# Patient Record
Sex: Female | Born: 2000 | Race: Black or African American | Hispanic: No | Marital: Single | Smoking: Never smoker
Health system: Southern US, Community
[De-identification: ages and names within clinical notes are randomized; demographics above are authoritative.]

## PROBLEM LIST (undated history)

## (undated) DIAGNOSIS — F32A Depression, unspecified: Secondary | ICD-10-CM

## (undated) DIAGNOSIS — E282 Polycystic ovarian syndrome: Secondary | ICD-10-CM

## (undated) DIAGNOSIS — H547 Unspecified visual loss: Secondary | ICD-10-CM

## (undated) DIAGNOSIS — F329 Major depressive disorder, single episode, unspecified: Secondary | ICD-10-CM

## (undated) DIAGNOSIS — F909 Attention-deficit hyperactivity disorder, unspecified type: Secondary | ICD-10-CM

## (undated) DIAGNOSIS — F419 Anxiety disorder, unspecified: Secondary | ICD-10-CM

## (undated) DIAGNOSIS — F209 Schizophrenia, unspecified: Secondary | ICD-10-CM

## (undated) HISTORY — PX: TONSILLECTOMY: SUR1361

## (undated) HISTORY — PX: ADENOIDECTOMY: SUR15

---
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
## 2021-01-26 ENCOUNTER — Ambulatory Visit (INDEPENDENT_AMBULATORY_CARE_PROVIDER_SITE_OTHER): Payer: Medicaid Other | Admitting: Pediatrics

## 2021-01-26 ENCOUNTER — Encounter: Payer: Self-pay | Admitting: Pediatrics

## 2021-01-26 ENCOUNTER — Other Ambulatory Visit: Payer: Self-pay

## 2021-01-26 VITALS — BP 110/68 | Wt 217.6 lb

## 2021-01-26 DIAGNOSIS — E559 Vitamin D deficiency, unspecified: Secondary | ICD-10-CM | POA: Diagnosis not present

## 2021-01-26 DIAGNOSIS — N926 Irregular menstruation, unspecified: Secondary | ICD-10-CM | POA: Diagnosis not present

## 2021-01-26 DIAGNOSIS — R1084 Generalized abdominal pain: Secondary | ICD-10-CM | POA: Diagnosis not present

## 2021-01-26 NOTE — Patient Instructions (Signed)
I will contact you with the lab results. I will also send the referral to Gynecology for consultation.  Continue ample hydration, diet rich in fruits and vegetables; avoid fatty and spicy foods

## 2021-01-26 NOTE — Progress Notes (Signed)
Subjective:    Patient ID: Sabrina Poole, female    DOB: [birth year], 20 y.o.   MRN: [id number]  HPI Sabrina Poole is here for follow up on response to OCP for irregular menstruation and for follow up on lab abnormalities.  She is accompanied by her mother and younger brother. Sabrina Poole has been followed for irregular menses since at least 2018.  Menarche age 54 years. She has mental health complications with anxiety and originally did not accept referral to Adolescent Medicine (2019) or other intervention until seen at her physical in 09/2020.  Testing was done for PCOS and trial of OCPs to regulate cycles was offered and accepted.  Sabrina Poole states she started the OCP as prescribed but stopped them after the first pack because she felt more depressed and was self-harming (would not elaborate on this).  States she did have a period of withdrawal bleeding with the OCP that lasted one week. States now one month without the OCP and feels much better with her mood. She is not sexually active.  Mom states one positive was that the chronic intermittent abdominal pain was better when she took the OCPs. Sabrina Poole reports "every time I eat it kills my stomach"; reports pain, bloating.  Tolerates fruits and vegetables without symptoms. Normal stool every other day.  No vomiting. Normal urination.  Feels better after napping sometimes; no meds help (tried Maalox, etc). Asleep 10/11 pm to 7 am and pain does not awaken her. Previous labs to look for celiac disease were negative. No other modifying factors or associated symptoms.   PMH, problem list, medications and allergies, family and social history reviewed and updated as indicated. 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 She is currently not followed by any specialty care practices and takes no medication except vitamin and mineral supplement.    Seizures have not been an issue for quite some time; however, she is prescribed medication for mood and ADHD but has chosen not to take them for the past 2 years.  Outpatient Encounter Medications as of 01/26/2021  Medication Sig  . calcium-vitamin D (OSCAL WITH D) 500-200 MG-UNIT tablet Take 1 tablet by mouth.  . Multiple Vitamin (MULTIVITAMIN) tablet Take 1 tablet by mouth daily.  . [DISCONTINUED] famotidine (PEPCID) 20 MG tablet Take 1 tablet (20 mg total) by mouth 2 (two) times daily.  . [DISCONTINUED] norgestimate-ethinyl estradiol (ORTHO-CYCLEN) 0.25-35 MG-MCG tablet Take one tablet daily to manage menstrual cycles.  Do not skip pills.  . [DISCONTINUED] Vitamin D, Ergocalciferol, (DRISDOL) 1.25 MG (50000 UNIT) CAPS capsule Take one capsule by mouth once avery 7 days for 8 weeks to treat low Vitamin D level   No facility-administered encounter medications on file as of 01/26/2021.    Review of Systems As noted above.    Objective:   Physical Exam Vitals and nursing note reviewed.  Constitutional:      Appearance: Normal appearance.  Cardiovascular:     Rate and Rhythm: Normal rate and regular rhythm.     Pulses: Normal pulses.     Heart sounds: Normal heart sounds.  Pulmonary:     Effort: Pulmonary effort is normal. No respiratory distress.     Breath sounds: Normal breath sounds.  Abdominal:     General: There is no distension.     Palpations: Abdomen is soft. There is no mass.     Tenderness: There is no abdominal tenderness. There is no guarding.  Skin:    General: Skin is warm and dry.     Capillary Refill: Capillary refill takes less than 2 seconds.     Comments: Mild acanthosis nigricans at her neck  Neurological:     Mental Status: She is alert.    Wt Readings from Last 3 Encounters:  01/26/21 217 lb 9.6 oz (98.7 kg) (99 %, Z= 2.18)*  10/03/20 213 lb (96.6 kg) (98 %, Z= 2.13)*  10/18/19 205 lb 9.6 oz (93.3 kg) (98 %, Z= 2.06)*   * Growth percentiles are based  on CDC (Girls, 2-20 Years) data.      Assessment & Plan:   1. Irregular menses   2. Vitamin D deficiency   3. Generalized abdominal pain   Discussed alternate therapy to regulate cycles and discussed management by gynecology.  Sabrina Poole stated willingness to follow through with this but would like a female provider and no pelvic exam.  Leading diagnosis still likely PCOS based on obesity, prior signs of hyperinsulinemia (Acanthosis nigricans and past elevated Hemoglobin A1c - last elevated in 2019). Unable to do labs today due to no phlebotomist; scheduled return visit for this. Abdominal pain has not led to unexpected weight loss, vomiting or diarrhea and she is able to eat fruits and vegetables. Will review labs and consider referral to GI to further evaluate abdominal pain and may also look for possible H pylori. She is advised on healthful diet avoiding fatty and spicy foods, ample hydration. Orders Placed This Encounter  Procedures  . VITAMIN D 25 Hydroxy (Vit-D Deficiency, Fractures)  . CBC with Differential/Platelet  . Comprehensive metabolic panel  . Lipase  . Ambulatory referral to Gynecology  Follow up to be determined after labs resulted. Discussed with Sabrina Poole use of her MyChart account to stay informed. Sabrina Poole and her mom voiced understanding and agreement with plan of care. Greater than 50% of this 35 minute face to face encounter spent in counseling for presenting issues. Maree Erie, MD

## 2021-01-28 ENCOUNTER — Other Ambulatory Visit: Payer: Medicaid Other

## 2021-02-09 ENCOUNTER — Other Ambulatory Visit: Payer: Medicaid Other

## 2021-03-02 ENCOUNTER — Ambulatory Visit: Payer: Medicaid Other | Admitting: Advanced Practice Midwife

## 2021-03-26 DIAGNOSIS — F902 Attention-deficit hyperactivity disorder, combined type: Secondary | ICD-10-CM

## 2021-03-26 DIAGNOSIS — G40909 Epilepsy, unspecified, not intractable, without status epilepticus: Secondary | ICD-10-CM

## 2021-04-06 ENCOUNTER — Other Ambulatory Visit: Payer: Self-pay

## 2021-04-06 ENCOUNTER — Encounter: Payer: Self-pay | Admitting: Advanced Practice Midwife

## 2021-04-06 ENCOUNTER — Ambulatory Visit (INDEPENDENT_AMBULATORY_CARE_PROVIDER_SITE_OTHER): Payer: Medicaid Other | Admitting: Advanced Practice Midwife

## 2021-04-06 VITALS — BP 119/79 | HR 63 | Ht 66.0 in | Wt 215.0 lb

## 2021-04-06 DIAGNOSIS — N939 Abnormal uterine and vaginal bleeding, unspecified: Secondary | ICD-10-CM | POA: Diagnosis not present

## 2021-04-06 DIAGNOSIS — N914 Secondary oligomenorrhea: Secondary | ICD-10-CM | POA: Diagnosis not present

## 2021-04-06 MED ORDER — NORETHIN-ETH ESTRAD-FE BIPHAS 1 MG-10 MCG / 10 MCG PO TABS: 1.0000 | ORAL_TABLET | Freq: Every day | ORAL | 11 refills | Status: DC

## 2021-04-06 NOTE — Progress Notes (Signed)
  GYNECOLOGY PROGRESS NOTE  History:  20 y.o. G0P0 presents to [facility] office today for problem gyn visit. She reports periods 1-2 times per year only that are painful.  Menarche was age 45 and and were regular initially, becoming less often around age 54. Periods are 4-8 months apart, last around 5 days each, and are not heavy per pt.  They are, however, very painful, causing missed school/work.  Pediatrician prescribed OCPs but pt had mood changes and did not finish the first pack.  She is not sexually active and denies need for pregnancy prevention or STD testing.  The following portions of the patient's history were reviewed and updated as appropriate: allergies, current medications, past family history, past medical history, past social history, past surgical history and problem list.   Health Maintenance Due  Topic Date Due   HPV VACCINES (1 - 2-dose series) Never done   Hepatitis C Screening  Never done     Review of Systems:  Pertinent items are noted in HPI.   Objective:  Physical Exam Blood pressure 119/79, pulse 63, height 5\' 6"  (1.676 m), weight 215 lb (97.5 kg), last menstrual period 04/04/2021. VS reviewed, nursing note reviewed,  Constitutional: well developed, well nourished, no distress HEENT: normocephalic CV: normal rate Pulm/chest wall: normal effort Breast Exam: deferred Abdomen: soft Neuro: alert and oriented x 3 Skin: warm, dry Psych: affect normal Pelvic exam: Deferred  Assessment & Plan:  1. Abnormal uterine bleeding (AUB) --Pt took OCPs, Ortho cyclen, prescribed by PCP but had mood changes, no thoughts of harming self or others. --Discussed options, including intermittent tx of bleeding with progesterone, switch to lower dose pills, and/or trying other progesterone only forms of contraception.  Discussed risks/benefits of each.   --Pt to try lowest dose pill, f/u in 1 month, or 6 weeks per pt schedule.  --Pt is having bleeding today so start OCPs  today --Call office or seek help if severe depression or thoughts of harming self or others  - Norethindrone-Ethinyl Estradiol-Fe Biphas (LO LOESTRIN FE) 1 MG-10 MCG / 10 MCG tablet; Take 1 tablet by mouth daily.  Dispense: 28 tablet; Refill: 11 - Beta hCG quant (ref lab)  2. Secondary oligomenorrhea --Discussed 04/06/2021, pt would prefer to avoid TVUS if possible, so plan to treat with OCPs and do labwork with close follow up.  - FSH - TSH - Prolactin - Testosterone, Free, Total, SHBG - Beta hCG quant (ref lab)   [place], CNM 12:24 PM

## 2021-04-09 LAB — PROLACTIN: Prolactin: 1.5 ng/mL — ABNORMAL LOW (ref 4.8–23.3)

## 2021-04-09 LAB — FOLLICLE STIMULATING HORMONE: FSH: 4.3 m[IU]/mL

## 2021-04-09 LAB — TESTOSTERONE, FREE, TOTAL, SHBG
Sex Hormone Binding: 22.5 nmol/L — ABNORMAL LOW (ref 24.6–122.0)
Testosterone, Free: 1.3 pg/mL
Testosterone: 24 ng/dL (ref 13–71)

## 2021-04-09 LAB — BETA HCG QUANT (REF LAB): hCG Quant: <1 m[IU]/mL

## 2021-04-09 LAB — TSH: TSH: 1.81 u[IU]/mL (ref 0.450–4.500)

## 2021-05-25 ENCOUNTER — Telehealth: Payer: Self-pay

## 2021-05-25 ENCOUNTER — Ambulatory Visit: Payer: Medicaid Other | Admitting: Advanced Practice Midwife

## 2021-05-25 NOTE — Telephone Encounter (Signed)
Return call to pt /mother regarding triage vm  No answer LVM

## 2021-05-26 ENCOUNTER — Other Ambulatory Visit: Payer: Self-pay | Admitting: Advanced Practice Midwife

## 2021-05-26 DIAGNOSIS — N939 Abnormal uterine and vaginal bleeding, unspecified: Secondary | ICD-10-CM

## 2021-05-26 MED ORDER — SLYND 4 MG PO TABS
4.0000 mg | ORAL_TABLET | Freq: Every day | ORAL | 11 refills | Status: DC
Start: 2021-05-26 — End: 2022-07-27

## 2021-05-26 NOTE — Progress Notes (Signed)
Pt reported mood swings, severe psychological symptoms with lo loestrin Rx taken for only a few days.  She has hx of reaction to higher dose estrogen OCPs as well.  Symptoms improved after stopping OCPs.    Will try progesterone only pills and pt to follow up with provider in the office.  Rx for [facility] sent to pharmacy.

## 2021-12-31 ENCOUNTER — Other Ambulatory Visit: Payer: Self-pay

## 2021-12-31 ENCOUNTER — Ambulatory Visit (INDEPENDENT_AMBULATORY_CARE_PROVIDER_SITE_OTHER): Payer: Medicaid Other | Admitting: Pediatrics

## 2021-12-31 ENCOUNTER — Other Ambulatory Visit (HOSPITAL_COMMUNITY)
Admission: RE | Admit: 2021-12-31 | Discharge: 2021-12-31 | Disposition: A | Discharge status: Home / Self Care | Payer: Medicaid Other | Source: Ambulatory Visit | Attending: Pediatrics | Admitting: Pediatrics

## 2021-12-31 VITALS — BP 118/76 | HR 89 | Ht 66.54 in | Wt 221.6 lb

## 2021-12-31 DIAGNOSIS — Z114 Encounter for screening for human immunodeficiency virus [HIV]: Secondary | ICD-10-CM

## 2021-12-31 DIAGNOSIS — Z113 Encounter for screening for infections with a predominantly sexual mode of transmission: Secondary | ICD-10-CM | POA: Insufficient documentation

## 2021-12-31 DIAGNOSIS — K5900 Constipation, unspecified: Secondary | ICD-10-CM

## 2021-12-31 DIAGNOSIS — Z0000 Encounter for general adult medical examination without abnormal findings: Secondary | ICD-10-CM

## 2021-12-31 DIAGNOSIS — Z68.41 Body mass index (BMI) pediatric, greater than or equal to 95th percentile for age: Secondary | ICD-10-CM | POA: Diagnosis not present

## 2021-12-31 DIAGNOSIS — F32A Depression, unspecified: Secondary | ICD-10-CM

## 2021-12-31 DIAGNOSIS — E559 Vitamin D deficiency, unspecified: Secondary | ICD-10-CM

## 2021-12-31 DIAGNOSIS — IMO0002 Reserved for concepts with insufficient information to code with codable children: Secondary | ICD-10-CM

## 2021-12-31 DIAGNOSIS — Z13220 Encounter for screening for lipoid disorders: Secondary | ICD-10-CM

## 2021-12-31 LAB — POCT RAPID HIV: Rapid HIV, POC: NEGATIVE

## 2021-12-31 NOTE — Patient Instructions (Signed)
Continue healthy variety of foods - fruits, vegetables, lean meats, grains like oats/whole wheat/brown rice. ? ?Lot so fluids to drink - mostly water. ? ?I am sending a prescription for Miralax to help you have more regular stools.  Mix as prescribed; decrease dose to 1/2 cap or skip dose if stools too loose ? ? ?Get some daylight exposure EVERY DAY - outside when the weather is good, inside with curtains open if weather is not good. ? ?Please contact your psychiatrist to restart med for mood - explain to that doctor how your medication has not been  a good match in the past. ? ?Embrace the wonderful you that you are! ?Look into careers that make you happy. ?Enjoy time with family and friends and let them know when you need a break for some alone time. ? ?See you in 3 months! ?

## 2021-12-31 NOTE — Progress Notes (Signed)
Adolescent Well Care Visit ?Sabrina Poole is a 21 y.o. female who is here for well care. ?Sabrina Poole has chronic health issues of Sturge-Weber syndrome, chronic depression, seizure disorder, ADHD and irregular menstrual cycles.. ?   ?PCP:  Maree Erie, MD ? ? History was provided by the patient.  Her mother is present for initial portion of visit and waits in hallway during physical exam. ? ?Confidentiality was discussed with the patient and, if applicable, with caregiver as well. ?Patient's personal or confidential phone number: [phone] ? ? ?Current Issues: ?Current concerns include doing well except depression. ?No meds now except Vitamin D.  ?- She was seen 04/06/2021 by Gyn [facility]) about abnormal uterine bleeding and hormonal management prescribed as OCP.  Iysis states she stopped taking the medication because it made her mood worse (depression). ?- She was seen by Neurology ([facility]) on 11/16/2021 about chronic seizure disorder.  Lamictal prescribed; however, she has chosen to not take it. ? ?Nutrition: ?Nutrition/Eating Behaviors: fruits and vegetables for good variety of foods.  Follows gluten-free guidelines. ?Adequate calcium in diet?: no milk ?Supplements/ Vitamins: Vitamin D 1000 units daily ? ?Elimination: ?Normal urinary habits. ?Goes days with no stool and has bloating.   ? ?Exercise/ Media: ?Play any Sports?/ Exercise: walking at least 3 times a week ?Screen Time:  > 2 hours-counseling provided - likes games and states she plays video games with her boyfriend. Smiles and tells MD this makes her happy. ?Media Rules or Monitoring?: yes ? ?Sleep:  ?Sleep: 9/10 pm to 5/6 am; occasionally wakes up at night and sometimes naps ? ?Social Screening: ?Lives with:  parents and younger siblings ?Parental relations:  good ?Activities, Work, and Chores?: washes dishes and cleans the living room ?Concerns regarding behavior with peers?  None - sticks with family ?Stressors of note: none  stated ? ?Education: ?School Name: graduated online school class of 2022  ?Has not made plans for career or employment. ? ?Menstruation:   ?Menstrual History: last period was Late Feb for 4/5 days but not taking pills  ? ?Confidential Social History: ?Tobacco?  no ?Secondhand smoke exposure?  no ?Drugs/ETOH?  marijuana ? ?Sexually Active?  no   ?Pregnancy Prevention: abstinence ? ?Safe at home, in school & in relationships?  Yes ?Safe to self?  See PHQ-9 ? ?Screenings: ?Patient has a dental home: yes ?Goes for eye exam end of March ? ?The patient completed the Rapid Assessment for Adolescent Preventive Services screening questionnaire and the following topics were identified as risk factors and discussed: healthy eating, exercise, marijuana use, suicidality/self harm, and mental health issues  ?In addition, the following topics were discussed as part of anticipatory guidance mental health issues, screen time, and sleep . ? ?PHQ-9 completed and results indicated increased risk with score of 17; past thoughts of self-harm. ?Mom filled in most of information with Denette's response.  Mom also stated depression symptoms observed at home. ?Sabrina Poole has been prescribed medication in the past but states she does not like how meds make her feel and does not like taking pills. ? ?The patient completed the Transition Skills Assessment for Young Adults screening questionnaire and the following topics were identified as learning needs and discussed:  medical information, insurance and office number. Also, family history and how to complete forms.  ? ?Physical Exam:  ?Vitals:  ? 12/31/21 1031  ?BP: 118/76  ?Pulse: 89  ?Weight: 221 lb 9.6 oz (100.5 kg)  ?Height: 5' 6.54" (1.69 m)  ? ?Wt Readings from Last  3 Encounters:  ?12/31/21 221 lb 9.6 oz (100.5 kg) (99 %, Z= 2.23)*  ?04/06/21 215 lb (97.5 kg) (98 %, Z= 2.15)*  ?01/26/21 217 lb 9.6 oz (98.7 kg) (99 %, Z= 2.18)*  ? ?* Growth percentiles are based on CDC (Girls, 2-20 Years) data.   ?  ?BP 118/76 (BP Location: Left Arm, Patient Position: Sitting)   Pulse 89   Ht 5' 6.54" (1.69 m)   Wt 221 lb 9.6 oz (100.5 kg)   BMI 35.19 kg/m?  ?Body mass index: body mass index is 35.19 kg/m?. ?Blood pressure percentiles are not available for patients who are 18 years or older. ? ?BP Readings from Last 3 Encounters:  ?12/31/21 118/76  ?04/06/21 119/79  ?01/26/21 110/68  ?  ? ?Hearing Screening  ?Method: Audiometry  ? 500Hz  1000Hz  2000Hz  4000Hz   ?Right ear 20 20 20 20   ?Left ear 20 20 20 20   ? ?Vision Screening  ? Right eye Left eye Both eyes  ?Without correction 20/80 20/80 20/80   ?With correction     ? ? ?General Appearance:   alert, oriented, no acute distress; however, she was initially wearing hood and cap so her face was covered.  Removed both and engaged with MD with good eye contact and often smiled  ?HENT: Normocephalic, no obvious abnormality, conjunctiva clear  ?Mouth:   Normal appearing teeth, no obvious discoloration, dental caries, or dental caps  ?Neck:   Supple; thyroid: no enlargement, symmetric, no tenderness/mass/nodules  ?Chest Normal female  ?Lungs:   Clear to auscultation bilaterally, normal work of breathing  ?Heart:   Regular rate and rhythm, S1 and S2 normal, no murmurs;   ?Abdomen:   Soft, non-tender, no mass, or organomegaly  ?GU genitalia not examined  ?Musculoskeletal:   Tone and strength strong and symmetrical, all extremities             ?  ?Lymphatic:   No cervical adenopathy  ?Skin/Hair/Nails:   Skin warm, dry and intact, no rashes, no bruises or petechiae.  Minimal facial acne lesions.  Striae at arms.  No significant acanthosis  ?Neurologic:   Strength, gait, and coordination normal and age-appropriate  ? ? ? ?Assessment and Plan:  ? ?1. Encounter for general adult medical examination without abnormal findings ?Hearing screening result:normal ?Vision screening result: abnormal; has appointment for vision care later this month ?Age appropriate anticipatory guidance  provided. ? ?BMI at 97%; discussed health at any size with patient. ?Review of her BMI curve had been in this range since age 33 years. ?Weight change has limited to 4 pound increase in the past year; also margin of error because she is wearing a thick hooded sweat suit today. ? ?2. Screening examination for venereal disease ?Routine screening; will contact patient with management as needed. ?- POCT Rapid HIV ?- Urine cytology ancillary only ? ?3. Vitamin D deficiency ?History of low values with las Vit D level of 12 on Nov 16, 2021 at outside center ([facility]). ?Will check today to see if medical intervention was successful in raising level to normal range. ?- VITAMIN D 25 Hydroxy (Vit-D Deficiency, Fractures) ? ?4. Screening, lipid ?Cholesterol last checked in 2020; she has increased risk due to obesity. ?Will follow up with results. ?- Lipid panel ? ?5. Constipation, unspecified constipation type ?Discussed healthful diet with fiber and adequate water intake.  Will prescribe Miralax to use as needed to maintain one or 2 soft stools at least every other day. ?- polyethylene glycol powder (GLYCOLAX/MIRALAX) 17 GM/SCOOP  powder; Mix one capful in 8 ounces of liquid and drink daily when needed to manage constipation  Dispense: 506 g; Refill: 2 ? ?6. Depression, unspecified depression type ?Chronic problem affecting her emotional, social and physical well-being.  She lives at home and mom is active in supervising her wellness and safety.  I talked alone with [name] about need to speak up with specialists about how her meds make her feel and what her goals are for wellness; this allows change in meds to select one appropriate for he long term management. ?She agreed to contact her psychiatrist for appointment. ?Also, discussed acts of wellness with [name] like daylight exposure, healthful eating, hydration and exercise. ?She will follow up with me in 3 months and as needed. ?[facility] her next week to  see if she needs assistance in connecting with psychiatry. ? ?[name] completed transition to adult med form with minor gaps in readiness not identified; however, she will likely benefit from remaining with this practice u

## 2022-01-01 ENCOUNTER — Encounter: Payer: Self-pay | Admitting: Pediatrics

## 2022-01-01 DIAGNOSIS — E559 Vitamin D deficiency, unspecified: Secondary | ICD-10-CM

## 2022-01-01 LAB — LIPID PANEL
Cholesterol: 170 mg/dL — ABNORMAL HIGH (ref <170)
HDL: 42 mg/dL — ABNORMAL LOW (ref >45)
LDL Cholesterol (Calc): 104 mg/dL (calc) (ref <110)
Non-HDL Cholesterol (Calc): 128 mg/dL (calc) — ABNORMAL HIGH (ref <120)
Total CHOL/HDL Ratio: 4 (calc) (ref <5.0)
Triglycerides: 140 mg/dL — ABNORMAL HIGH (ref <90)

## 2022-01-01 LAB — URINE CYTOLOGY ANCILLARY ONLY
Chlamydia: NEGATIVE
Comment: NEGATIVE
Comment: NORMAL
Neisseria Gonorrhea: NEGATIVE

## 2022-01-01 LAB — VITAMIN D 25 HYDROXY (VIT D DEFICIENCY, FRACTURES): Vit D, 25-Hydroxy: 17 ng/mL — ABNORMAL LOW (ref 30–100)

## 2022-01-04 ENCOUNTER — Encounter: Payer: Self-pay | Admitting: Pediatrics

## 2022-01-04 MED ORDER — VITAMIN D (ERGOCALCIFEROL) 1.25 MG (50000 UNIT) PO CAPS: ORAL_CAPSULE | ORAL | 0 refills | Status: DC

## 2022-01-04 MED ORDER — POLYETHYLENE GLYCOL 3350 17 GM/SCOOP PO POWD: ORAL | 2 refills | Status: DC

## 2022-02-26 ENCOUNTER — Encounter: Payer: Self-pay | Admitting: Pediatrics

## 2022-04-05 ENCOUNTER — Encounter: Payer: Self-pay | Admitting: Pediatrics

## 2022-04-05 ENCOUNTER — Ambulatory Visit (INDEPENDENT_AMBULATORY_CARE_PROVIDER_SITE_OTHER): Payer: Medicaid Other | Admitting: Pediatrics

## 2022-04-05 VITALS — BP 122/74 | Ht 66.3 in | Wt 210.8 lb

## 2022-04-05 DIAGNOSIS — Z7187 Encounter for pediatric-to-adult transition counseling: Secondary | ICD-10-CM

## 2022-04-05 DIAGNOSIS — R109 Unspecified abdominal pain: Secondary | ICD-10-CM

## 2022-04-05 DIAGNOSIS — N898 Other specified noninflammatory disorders of vagina: Secondary | ICD-10-CM

## 2022-04-05 DIAGNOSIS — E559 Vitamin D deficiency, unspecified: Secondary | ICD-10-CM

## 2022-04-05 DIAGNOSIS — G8929 Other chronic pain: Secondary | ICD-10-CM

## 2022-04-05 DIAGNOSIS — F32A Depression, unspecified: Secondary | ICD-10-CM

## 2022-04-05 NOTE — Progress Notes (Unsigned)
   Subjective:    Patient ID: Sabrina Poole, female    DOB: October 09, 2001, 21 y.o.   MRN: [id number]  HPI  [id number]   Completed Vitamin D supplement and is here for lab draw. 2.    Problem with vaginal itching since period x 2 cycles; no noted discharge. 3.    Would like to restart meds for depression 4.    Would like transition to same location as mom Review of Systems As noted in HPI above    Objective:   Physical Exam    BP Readings from Last 3 Encounters:  04/05/22 122/74  12/31/21 118/76  04/06/21 119/79    BP Readings from Last 3 Encounters:  04/05/22 122/74  12/31/21 118/76  04/06/21 119/79       Assessment & Plan:

## 2022-04-06 LAB — WET PREP BY MOLECULAR PROBE
Candida species: NOT DETECTED
MICRO NUMBER:: 13543287
SPECIMEN QUALITY:: ADEQUATE
Trichomonas vaginosis: NOT DETECTED

## 2022-04-06 LAB — VITAMIN D 25 HYDROXY (VIT D DEFICIENCY, FRACTURES): Vit D, 25-Hydroxy: 59 ng/mL (ref 30–100)

## 2022-04-07 NOTE — Telephone Encounter (Signed)
Sabrina Poole is scheduled for labs on 6/22 and will follow up with me during the wait between breath samples.

## 2022-04-08 ENCOUNTER — Ambulatory Visit (INDEPENDENT_AMBULATORY_CARE_PROVIDER_SITE_OTHER): Payer: Medicaid Other | Admitting: Pediatrics

## 2022-04-08 ENCOUNTER — Other Ambulatory Visit: Payer: Medicaid Other

## 2022-04-08 ENCOUNTER — Encounter: Payer: Self-pay | Admitting: Pediatrics

## 2022-04-08 VITALS — Wt 210.0 lb

## 2022-04-08 DIAGNOSIS — B9689 Other specified bacterial agents as the cause of diseases classified elsewhere: Secondary | ICD-10-CM

## 2022-04-08 DIAGNOSIS — E559 Vitamin D deficiency, unspecified: Secondary | ICD-10-CM | POA: Diagnosis not present

## 2022-04-08 DIAGNOSIS — R1084 Generalized abdominal pain: Secondary | ICD-10-CM | POA: Diagnosis not present

## 2022-04-08 DIAGNOSIS — N760 Acute vaginitis: Secondary | ICD-10-CM

## 2022-04-08 MED ORDER — METRONIDAZOLE 500 MG PO TABS: ORAL_TABLET | ORAL | 0 refills | Status: DC

## 2022-04-08 NOTE — Patient Instructions (Signed)
You will get a call (listed mom's number) about new psychiatry appointment and about establishing care with [facility] on [facility].  Please let me know if you do not receive any acknowledgement of referrals by July 7th

## 2022-04-08 NOTE — Progress Notes (Signed)
   Subjective:    Patient ID: Harrietta Guardian, female    DOB: May 01, 2001, 21 y.o.   MRN: [id number]  HPI Katrese is here today for her urea breath test and to review her labs.  Shandrea reports doing well with no new concerns today. No recent ills. No change in meds. No sexual activity. No alcohol.  PMH, problem list, medications and allergies, family and social history reviewed and updated as indicated.   Review of Systems  Constitutional: Negative.       Objective:   Physical Exam Vitals and nursing note reviewed.  Constitutional:      General: She is not in acute distress.    Appearance: Normal appearance.  Neurological:     Mental Status: She is alert.        Assessment & Plan:   1. Bacterial vaginitis I discussed with Alima that recent vaginal swab (04/05/21) returned positive for Gardnerella vaginalis and significance of this explained.  Discussed treatment options of oral or vaginal meds and Haven chose oral meds.  No current contraindication to medication.  Reviewed dosing and sent to pharmacy.  Cautioned no alcohol.  Follow up as needed. Meds ordered this encounter  Medications   metroNIDAZOLE (FLAGYL) 500 MG tablet    Sig: Take one tablet by mouth twice a day for 7 days to treat vaginitis    Dispense:  14 tablet    Refill:  0    2. Vitamin D deficiency Discussed recent lab value normal; continue OTC Vitamin D supplement.  3. Generalized abdominal pain Urea breath test done today; I verified with lab technician that results should be available tomorrow. Discussed with Derhonda I will contact her and if positive, medications will be needed.  She voiced understanding and agreement with care plan from today.   Time spent reviewing documentation and services related to visit: 2 min Time spent face-to-face with patient for visit: 10 min Time spent not face-to-face with patient for documentation and care coordination: 5 min  Maree Erie, MD

## 2022-04-08 NOTE — Patient Instructions (Addendum)
Take the Metronidazole (also known as Flagyl) as prescribed 2 times a day for 7 days. Let me know if you have any problems.  I will contact you once the breath test is resulted.  Bacterial Vaginosis  Bacterial vaginosis is an infection of the vagina. It happens when too many normal germs (healthy bacteria) grow in the vagina. This infection can make it easier to get other infections from sex (STIs). It is very important for pregnant women to get treated. This infection can cause babies to be born early or at a low birth weight. What are the causes? This infection is caused by an increase in certain germs that grow in the vagina. You cannot get this infection from toilet seats, bedsheets, swimming pools, or things that touch your vagina. What increases the risk? Having sex with a new person or more than one person. Having sex without protection. Douching. Having an intrauterine device (IUD). Smoking. Using drugs or drinking alcohol. These can lead you to do things that are risky. Taking certain antibiotic medicines. Being pregnant. What are the signs or symptoms? Some women have no symptoms. Symptoms may include: A discharge from your vagina. It may be gray or white. It can be watery or foamy. A fishy smell. This can happen after sex or during your menstrual period. Itching in and around your vagina. A feeling of burning or pain when you pee (urinate). How is this treated? This infection is treated with antibiotic medicines. These may be given to you as: A pill. A cream for your vagina. A medicine that you put into your vagina (suppository). If the infection comes back after treatment, you may need more antibiotics. Follow these instructions at home: Medicines Take over-the-counter and prescription medicines as told by your doctor. Take or use your antibiotic medicine as told by your doctor. Do not stop taking or using it, even if you start to feel better. General instructions If  the person you have sex with is a woman, tell her that you have this infection. She will need to follow up with her doctor. If you have a female partner, he does not need to be treated. Do not have sex until you finish treatment. Drink enough fluid to keep your pee pale yellow. Keep your vagina and butt clean. Wash the area with warm water each day. Wipe from front to back after you use the toilet. If you are breastfeeding a baby, ask your doctor if you should keep doing so during treatment. Keep all follow-up visits. How is this prevented? Self-care Do not douche. Use only warm water to wash around your vagina. Wear underwear that is cotton or lined with cotton. Do not wear tight pants and pantyhose, especially in the summer. Safe sex Use protection when you have sex. This includes: Use condoms. Use dental dams. This is a thin layer that protects the mouth during oral sex. Limit how many people you have sex with. To prevent this infection, it is best to have sex with just one person. Get tested for STIs. The person you have sex with should also get tested. Drugs and alcohol Do not smoke or use any products that contain nicotine or tobacco. If you need help quitting, ask your doctor. Do not use drugs. Do not drink alcohol if: Your doctor tells you not to drink. You are pregnant, may be pregnant, or are planning to become pregnant. If you drink alcohol: Limit how much you have to 0-1 drink a day. Know how much  alcohol is in your drink. In the [place] one drink equals one 12 oz bottle of beer (355 mL), one 5 oz glass of wine (148 mL), or one 1 oz glass of hard liquor (44 mL). Where to find more information Centers for Disease Control and Prevention: FootballExhibition.com.br American Sexual Health Association: www.ashastd.org Office on [employer] Health: http://hoffman.com/ Contact a doctor if: Your symptoms do not get better, even after you are treated. You have more discharge or pain when you  pee. You have a fever or chills. You have pain in your belly (abdomen) or in the area between your hips. You have pain with sex. You bleed from your vagina between menstrual periods. Summary This infection can happen when too many germs (bacteria) grow in the vagina. This infection can make it easier to get infections from sex (STIs). Treating this can lower that chance. Get treated if you are pregnant. This infection can cause babies to be born early. Do not stop taking or using your antibiotic medicine, even if you start to feel better. This information is not intended to replace advice given to you by your health care provider. Make sure you discuss any questions you have with your health care provider. Document Revised: 04/03/2020 Document Reviewed: 04/03/2020 Elsevier Patient Education  2023 [employer].

## 2022-04-13 LAB — H. PYLORI BREATH TEST: H. pylori Breath Test: NOT DETECTED

## 2022-05-25 ENCOUNTER — Encounter: Payer: Self-pay | Admitting: Pediatrics

## 2022-07-27 ENCOUNTER — Other Ambulatory Visit (HOSPITAL_COMMUNITY)
Admission: RE | Admit: 2022-07-27 | Discharge: 2022-07-27 | Disposition: A | Discharge status: Home / Self Care | Payer: Medicaid Other | Source: Ambulatory Visit | Attending: Advanced Practice Midwife | Admitting: Advanced Practice Midwife

## 2022-07-27 ENCOUNTER — Encounter: Payer: Self-pay | Admitting: Advanced Practice Midwife

## 2022-07-27 ENCOUNTER — Ambulatory Visit (INDEPENDENT_AMBULATORY_CARE_PROVIDER_SITE_OTHER): Payer: Medicaid Other | Admitting: Advanced Practice Midwife

## 2022-07-27 VITALS — BP 120/75 | HR 66 | Ht 65.0 in | Wt 203.0 lb

## 2022-07-27 DIAGNOSIS — N898 Other specified noninflammatory disorders of vagina: Secondary | ICD-10-CM | POA: Insufficient documentation

## 2022-07-27 DIAGNOSIS — Z113 Encounter for screening for infections with a predominantly sexual mode of transmission: Secondary | ICD-10-CM | POA: Diagnosis present

## 2022-07-27 DIAGNOSIS — E282 Polycystic ovarian syndrome: Secondary | ICD-10-CM

## 2022-07-27 DIAGNOSIS — Z1331 Encounter for screening for depression: Secondary | ICD-10-CM

## 2022-07-27 DIAGNOSIS — N913 Primary oligomenorrhea: Secondary | ICD-10-CM | POA: Diagnosis not present

## 2022-07-27 DIAGNOSIS — R102 Pelvic and perineal pain: Secondary | ICD-10-CM

## 2022-07-27 MED ORDER — NORGESTIMATE-ETH ESTRADIOL 0.25-35 MG-MCG PO TABS: 1.0000 | ORAL_TABLET | Freq: Every day | ORAL | 11 refills | Status: DC

## 2022-07-27 NOTE — Progress Notes (Signed)
21 y.o. GYN presents for abdominal/pelvic pain 5-8/10 x 6 months.  She is requesting and [place] and labs to check her harmones.  PHQ-9=15

## 2022-07-27 NOTE — Progress Notes (Signed)
   GYNECOLOGY PROGRESS NOTE  History:  21 y.o. G0P0 presents to [place] office today for problem gyn visit. She reports no menses in 3-4 months, increased hair growth on face and body, and intermittent pelvic pain that is severe.  She is concerned that her PCOS is worsening.  She tried OCPs with mood side effects 2-3 years ago, so low dose OCP with loloestrin was initiated  04/06/21.  Pt had side effects, including mood changes, so Slynd POP was initiated 05/26/21.  Pt did not have menses, and continue to have other symptoms with Slynd. She is not currently taking the Slynd.   She denies h/a, dizziness, shortness of breath, n/v, or fever/chills.    The following portions of the patient's history were reviewed and updated as appropriate: allergies, current medications, past family history, past medical history, past social history, past surgical history and problem list.   Health Maintenance Due  Topic Date Due   COVID-19 Vaccine (1) Never done   HPV VACCINES (1 - 2-dose series) Never done   Hepatitis C Screening  Never done   TETANUS/TDAP  03/23/2022   INFLUENZA VACCINE  05/18/2022     Review of Systems:  Pertinent items are noted in HPI.   Objective:  Physical Exam Blood pressure 120/75, pulse 66, height 5\' 5"  (1.651 m), weight 203 lb (92.1 kg), last menstrual period 04/28/2022. VS reviewed, nursing note reviewed,  Constitutional: well developed, well nourished, no distress HEENT: normocephalic CV: normal rate Pulm/chest wall: normal effort Breast Exam: deferred Abdomen: soft Neuro: alert and oriented x 3 Skin: warm, dry Psych: affect normal Pelvic exam: Cervix pink, visually closed, without lesion, scant white creamy discharge, vaginal walls and external genitalia normal Bimanual exam: Cervix 0/long/high, firm, anterior, neg CMT, uterus nontender, nonenlarged, adnexa without tenderness, enlargement, or mass  Assessment & Plan:  1. Vaginal discharge  - Cervicovaginal ancillary  only( [place])  2. Positive screening for depression on 9-item Patient Health Questionnaire (PHQ-9)  - Ambulatory referral to [facility]  3. Primary oligomenorrhea --Pt last took OCPs a couple of years ago, in her teens.  She is not improved with POPs, initiated last year. Discussed options with pt, and pt would like to try OCPs again, to see if periods and mood can be regulated.   --Discussed how hair growth is not usually improved by OCPs alone, and may need additional treatment. --F/U in 2 months after initiating Sprintec today --Labs to evaluate other causes  - TSH - FSH - LH - Prolactin - Testosterone - US PELVIC COMPLETE WITH TRANSVAGINAL; Future - norgestimate-ethinyl estradiol (ORTHO-CYCLEN) 0.25-35 MG-MCG tablet; Take 1 tablet by mouth daily.  Dispense: 28 tablet; Refill: 11  4. Routine screening for STI (sexually transmitted infection)  - Cervicovaginal ancillary only( [place]) - HIV antibody (with reflex) - Hepatitis C Antibody - Hepatitis B Surface AntiGEN - [facility]  5. Acute pelvic pain, female --Pain intermittent, no pattern, not associated with menses  - US PELVIC COMPLETE WITH TRANSVAGINAL; Future   Return in about 2 months (around 09/26/2022) for Gyn follow up for Abnormal Uterine Bleeding with me.   Fatima Blank, CNM 12:31 PM

## 2022-07-28 LAB — TESTOSTERONE: Testosterone: 40 ng/dL (ref 13–71)

## 2022-07-28 LAB — CERVICOVAGINAL ANCILLARY ONLY
Bacterial Vaginitis (gardnerella): NEGATIVE
Candida Glabrata: NEGATIVE
Candida Vaginitis: NEGATIVE
Chlamydia: NEGATIVE
Comment: NEGATIVE
Comment: NEGATIVE
Comment: NEGATIVE
Comment: NEGATIVE
Comment: NEGATIVE
Comment: NORMAL
Neisseria Gonorrhea: NEGATIVE
Trichomonas: NEGATIVE

## 2022-07-28 LAB — LUTEINIZING HORMONE: LH: 11.7 m[IU]/mL

## 2022-07-28 LAB — HIV ANTIBODY (ROUTINE TESTING W REFLEX): HIV Screen 4th Generation wRfx: NONREACTIVE

## 2022-07-28 LAB — TSH: TSH: 1.31 u[IU]/mL (ref 0.450–4.500)

## 2022-07-28 LAB — HEPATITIS C ANTIBODY: Hep C Virus Ab: NONREACTIVE

## 2022-07-28 LAB — FOLLICLE STIMULATING HORMONE: FSH: 2.3 m[IU]/mL

## 2022-07-28 LAB — PROLACTIN: Prolactin: 1.5 ng/mL — ABNORMAL LOW (ref 4.8–23.3)

## 2022-07-28 LAB — HEPATITIS B SURFACE ANTIGEN: Hepatitis B Surface Ag: NEGATIVE

## 2022-07-28 LAB — RPR: RPR Ser Ql: NONREACTIVE

## 2022-08-02 ENCOUNTER — Ambulatory Visit
Admission: RE | Admit: 2022-08-02 | Discharge: 2022-08-02 | Disposition: A | Discharge status: Home / Self Care | Payer: Medicaid Other | Source: Ambulatory Visit | Attending: Advanced Practice Midwife | Admitting: Advanced Practice Midwife

## 2022-08-02 ENCOUNTER — Other Ambulatory Visit: Payer: Self-pay | Admitting: Advanced Practice Midwife

## 2022-08-02 ENCOUNTER — Encounter: Payer: Self-pay | Admitting: Advanced Practice Midwife

## 2022-08-02 DIAGNOSIS — N913 Primary oligomenorrhea: Secondary | ICD-10-CM | POA: Diagnosis present

## 2022-08-02 DIAGNOSIS — R102 Pelvic and perineal pain: Secondary | ICD-10-CM | POA: Diagnosis present

## 2022-08-09 ENCOUNTER — Encounter (HOSPITAL_COMMUNITY): Payer: Self-pay | Admitting: Student in an Organized Health Care Education/Training Program

## 2022-08-09 ENCOUNTER — Ambulatory Visit (HOSPITAL_BASED_OUTPATIENT_CLINIC_OR_DEPARTMENT_OTHER): Payer: Medicaid Other | Admitting: Student in an Organized Health Care Education/Training Program

## 2022-08-09 DIAGNOSIS — F411 Generalized anxiety disorder: Secondary | ICD-10-CM

## 2022-08-09 DIAGNOSIS — F339 Major depressive disorder, recurrent, unspecified: Secondary | ICD-10-CM

## 2022-08-09 DIAGNOSIS — F209 Schizophrenia, unspecified: Secondary | ICD-10-CM

## 2022-08-09 MED ORDER — RISPERIDONE 1 MG PO TABS: 1.0000 mg | ORAL_TABLET | Freq: Two times a day (BID) | ORAL | 1 refills | Status: DC

## 2022-08-09 NOTE — Progress Notes (Signed)
Psychiatric Initial Adult Assessment   Patient Identification: Shemicka Cohrs MRN:  [id number] Date of Evaluation:  08/09/2022 Referral Source: Maree Erie, MD Chief Complaint:   Chief Complaint  Patient presents with   Establish Care   Hallucinations   Visit Diagnosis:    ICD-10-CM   1. Schizophrenia, unspecified type (HCC)  F20.9 risperiDONE (RISPERDAL) 1 MG tablet    2. GAD (generalized anxiety disorder)  F41.1     3. Episode of recurrent major depressive disorder, unspecified depression episode severity (HCC)  F33.9       History of Present Illness:   Chrissa Meetze is a 21 yr old female who presents to establish care and for medication management.  PPHx is significant for Schizophrenia, Depression, Anxiety, and ADHD, Multiple Suicide Attempts (would not discuss reports last was 2-3 yrs ago), and Self Injurious Behavior (cutting/scratching last was 9/23), and no hospitalizations.  PMH is significant for Sturge Weber Syndrome, Epilepsy, and Non-Epileptic Seizures.   Patient presents today with her mother.  When asked why she was here today she shrugged her shoulders and stated she did not know.  The mother then reported that patient had schizophrenia, depression, anxiety, ADHD, anxiety induced seizures, impulse control issues, and concerns for bipolar disorder.  She reports that the patient had been taking medications in the past but decided to stop taking them and her symptoms had returned and been worsening.  Patient reports she stopped taking her medicines about 3 years ago and when asked why she states she just did not want to keep taking them.  She reports that she does see and hear things.  When asked to describe what she sees she states she cannot and usually has to draw them and the mother confirms that they are not humanoid shapes but the patient draws.  Patient reports hearing crowds and sometimes babies crying.  When asked about manic symptoms she does endorse high energy,  mood lability, decreased need of sleep, and starting several things and not finishing them.  When asked the last time when these episodes happened she reports that they happen all the time and could not give a definitive timeframe.  She reports past psychiatric history significant for schizophrenia, depression, anxiety, and ADHD.  She reports that there have been multiple suicide attempts in the past the last 1 being about 2 to 3 years ago.  When asked how she has attempted she reported that she could not say and began crying.  She does endorse a history of self-injurious behavior via scratching/cutting.  She reports the last episode happened about a month ago.  She reports no history of psychiatric hospitalizations and the mother confirms this.  They report patient has been on Adderall, Risperdal, and Zoloft in the past and many more but they cannot remove the names of them.  Past medical history is significant for Sturge Weber syndrome.  Past surgical history is significant for tonsillectomy/adenoidectomy in 2018.  She reports NKDA.  She reports no history of head trauma.  The mother reports patient did have a history of seizures starting at 86 months old given the Sturge Weber diagnosis.  There was also a hospitalization in 2017 for seizure-like activity.  She reports currently lives at her mother's house with her 3 siblings.  She reports she did graduate high school and reports no IEP.  She reports no college.  She reports she is not currently working.  She reports no alcohol use.  She reports no tobacco use.  She reports  THC use and when asked for frequency states whenever she feels like it.  She reports no current legal issues.  She reports no access to firearms.  Discussed restarting Risperdal since it controlled her symptoms well in the past.  Discussed potential risks and side effects and she was agreeable to a trial.  Discussed waiting for our follow-up appointment to see what further medication  changes would need to be made and she was agreeable to this.  She reports no SI, HI, or AVH.  She reports her sleep is good.  She reports her appetite is doing good.  Discussed with them what to do in the event of a future crisis.  Discussed that she can return to [facility], go to [facility], go to the nearest ED, or call [phone] or [phone].   They reported understanding and had no concerns.    Associated Signs/Symptoms: Depression Symptoms:  depressed mood, anhedonia, fatigue, anxiety, panic attacks, loss of energy/fatigue, disturbed sleep, (Hypo) Manic Symptoms:  Distractibility, Flight of Ideas, Irritable Mood, Labiality of Mood, Anxiety Symptoms:  Excessive Worry, Panic Symptoms, Social Anxiety, Psychotic Symptoms:  Hallucinations: Auditory Visual Paranoia, PTSD Symptoms: NA  Past Psychiatric History: Schizophrenia, Depression, Anxiety, and ADHD, Multiple Suicide Attempts (would not discuss reports last was 2-3 yrs ago), and Self Injurious Behavior (cutting/scratching last was 9/23), and no hospitalizations  Previous Psychotropic Medications: Yes   Substance Abuse History in the last 12 months:  Yes.    Consequences of Substance Abuse: NA  Past Medical History:  Past Medical History:  Diagnosis Date   ADHD (attention deficit hyperactivity disorder)    Anxiety    mild anxiety   Decreased visual acuity 12/03/2015   Depression    Schizophrenia (HCC)     Past Surgical History:  Procedure Laterality Date   ADENOIDECTOMY     RADIOLOGY WITH ANESTHESIA N/A 11/12/2015   Procedure: RADIOLOGY WITH ANESTHESIA;  Surgeon: Medication Radiologist, MD;  Location: [facility] OR;  Service: Radiology;  Laterality: N/A;   TONSILLECTOMY     TONSILLECTOMY AND ADENOIDECTOMY N/A 02/22/2017   Procedure: TONSILLECTOMY AND ADENOIDECTOMY;  Surgeon: Newman Pies, MD;  Location:  SURGERY CENTER;  Service: ENT;  Laterality: N/A;    Family Psychiatric History: Father- Not diagnosed but something, EtOH  abuse Mother- Panic Disorder No Known Suicides  Family History:  Family History  Problem Relation Age of Onset   Diabetes Mother    Allergic rhinitis Mother    Asthma Mother    Hypertension Maternal Grandmother    Heart disease Maternal Grandmother    Hyperlipidemia Maternal Grandmother    Hypertension Maternal Grandfather    Heart disease Maternal Grandfather    Hyperlipidemia Maternal Grandfather    Hypertension Paternal Grandmother    Heart disease Paternal Grandmother    Hyperlipidemia Paternal Grandmother    Hypertension Paternal Grandfather    Heart disease Paternal Grandfather    Hyperlipidemia Paternal Grandfather     Social History:   Social History   Socioeconomic History   Marital status: Single    Spouse name: Not on file   Number of children: Not on file   Years of education: Not on file   Highest education level: Not on file  Occupational History   Not on file  Tobacco Use   Smoking status: Never   Smokeless tobacco: Never  Vaping Use   Vaping Use: Never used  Substance and Sexual Activity   Alcohol use: No   Drug use: Yes    Types: Marijuana  Sexual activity: Never  Other Topics Concern   Not on file  Social History Narrative   Lylian lives with her mother and three younger siblings. She is home-schooled; she does well in school. She enjoys coloring, drawing, and watching movies. No pets in the house; no smokers in the house.   Social Determinants of Health   Financial Resource Strain: Not on file  Food Insecurity: No Food Insecurity (09/08/2019)   Hunger Vital Sign    Worried About Running Out of Food in the Last Year: Never true    Ran Out of Food in the Last Year: Never true  Transportation Needs: Not on file  Physical Activity: Not on file  Stress: Not on file  Social Connections: Not on file    Additional Social History: None  Allergies:  No Known Allergies  Metabolic Disorder Labs: Lab Results  Component Value Date   HGBA1C  5.6 10/03/2020   MPG 114 10/03/2020   MPG 114 09/10/2019   Lab Results  Component Value Date   PROLACTIN 1.5 (L) 07/27/2022   PROLACTIN 1.5 (L) 04/06/2021   Lab Results  Component Value Date   CHOL 170 (H) 12/31/2021   TRIG 140 (H) 12/31/2021   HDL 42 (L) 12/31/2021   CHOLHDL 4.0 12/31/2021   VLDL 20 06/20/2014   LDLCALC 104 12/31/2021   LDLCALC 91 10/03/2020   Lab Results  Component Value Date   TSH 1.310 07/27/2022    Therapeutic Level Labs: No results found for: "LITHIUM" No results found for: "CBMZ" No results found for: "VALPROATE"  Current Medications: Current Outpatient Medications  Medication Sig Dispense Refill   risperiDONE (RISPERDAL) 1 MG tablet Take 1 tablet (1 mg total) by mouth 2 (two) times daily. Take a half tablet twice a day for 5 days then increase to a whole tablet twice a day 60 tablet 1   calcium-vitamin D (OSCAL WITH D) 500-200 MG-UNIT tablet Take 1 tablet by mouth. (Patient not taking: Reported on 04/06/2021)     metroNIDAZOLE (FLAGYL) 500 MG tablet Take one tablet by mouth twice a day for 7 days to treat vaginitis 14 tablet 0   Multiple Vitamin (MULTIVITAMIN) tablet Take 1 tablet by mouth daily. (Patient not taking: Reported on 04/06/2021)     norgestimate-ethinyl estradiol (ORTHO-CYCLEN) 0.25-35 MG-MCG tablet Take 1 tablet by mouth daily. 28 tablet 11   polyethylene glycol powder (GLYCOLAX/MIRALAX) 17 GM/SCOOP powder Mix one capful in 8 ounces of liquid and drink daily when needed to manage constipation (Patient not taking: Reported on 07/27/2022) 506 g 2   No current facility-administered medications for this visit.    Musculoskeletal: Strength & Muscle Tone: within normal limits Gait & Station: normal Patient leans: N/A  Psychiatric Specialty Exam: Review of Systems  Respiratory:  Negative for shortness of breath.   Cardiovascular:  Negative for chest pain.  Gastrointestinal:  Negative for abdominal pain, constipation, diarrhea, nausea and  vomiting.  Neurological:  Negative for dizziness, weakness and headaches.  Psychiatric/Behavioral:  Positive for agitation, dysphoric mood and hallucinations. Negative for sleep disturbance and suicidal ideas. The patient is nervous/anxious.     Last menstrual period 04/28/2022.There is no height or weight on file to calculate BMI.  General Appearance: Casual and wore a hoodie with hood up during entire interview  Eye Contact:  Minimal  Speech:  Clear and Coherent and minimal  Volume:  Decreased  Mood:  Anxious and Depressed  Affect:  Congruent, Constricted, and Flat  Thought Process:  Coherent  Orientation:  Full (Time, Place, and Person)  Thought Content:  Hallucinations: Auditory Visual seeing and hearing things  Suicidal Thoughts:  No  Homicidal Thoughts:  No  Memory:  Immediate;   Fair  Judgement:  Fair  Insight:  Fair  Psychomotor Activity:  Normal  Concentration:  Concentration: Fair and Attention Span: Fair  Recall:  [employer] of Knowledge:Fair  Language: Fair  Akathisia:  Negative  Handed:  Right  AIMS (if indicated):  done AIMS=0   Assets:  Desire for Improvement Financial Resources/Insurance Housing Resilience Social Support  ADL's:  Intact  Cognition: Impaired,  Mild  Sleep:  Good   Screenings: PHQ2-9    [place] Visit from 07/27/2022 in [place] Visit from 12/31/2021 in [facility] [place] [facility] [place] Visit from 10/03/2020 in [facility] [place] [facility] and [place] [facility] Visit from 09/07/2019 in [facility] [place] [facility] and [place] from 07/10/2018 in [place] [facility] [place] [facility]  PHQ-2 Total Score 4 5 3 1 3   PHQ-9 Total Score 15 17 9 3 7        Assessment and Plan:  Alphia Behanna is a 21 yr old female who presents to establish care and for medication management.  PPHx  is significant for Schizophrenia, Depression, Anxiety, and ADHD, Multiple Suicide Attempts (would not discuss reports last was 2-3 yrs ago), and Self Injurious Behavior (cutting/scratching last was 9/23), and no hospitalizations.  PMH is significant for Sturge Weber Syndrome, Epilepsy, and Non-Epileptic Seizures.   Makala has done well on Risperdal in the past so we will restart this.  We will monitor for her response to it and if it is not too sedating will consider further increases and starting an antidepressant.  Overall clinical picture is unclear as there is concern for IDD vs congential issues vs Cluster B traits.  She will return for follow up in approximately 4 weeks.   Schizophrenia  MDD  GAD  (possible Schizoaffective Disorder): -Start Risperdal 0.5 mg BID for 5 days then increase to 1 mg BID for psychosis and mood stability.  60 (1 mg) tablets with 1 refill. -Consider starting an antidepressant at next appointment   Collaboration of Care: Primary Care Provider AEB    Patient/Guardian was advised Release of Information must be obtained prior to any record release in order to collaborate their care with an outside provider. Patient/Guardian was advised if they have not already done so to contact the registration department to sign all necessary forms in order for [place] to release information regarding their care.   Consent: Patient/Guardian gives verbal consent for treatment and assignment of benefits for services provided during this visit. Patient/Guardian expressed understanding and agreed to proceed.   Briant Cedar, MD 10/23/202312:28 PM

## 2022-08-30 ENCOUNTER — Encounter (HOSPITAL_COMMUNITY): Payer: Self-pay | Admitting: Emergency Medicine

## 2022-08-30 ENCOUNTER — Ambulatory Visit (INDEPENDENT_AMBULATORY_CARE_PROVIDER_SITE_OTHER): Payer: Medicaid Other

## 2022-08-30 ENCOUNTER — Ambulatory Visit (HOSPITAL_COMMUNITY)
Admission: EM | Admit: 2022-08-30 | Discharge: 2022-08-30 | Disposition: A | Discharge status: Home / Self Care | Payer: Medicaid Other | Attending: Physician Assistant | Admitting: Physician Assistant

## 2022-08-30 DIAGNOSIS — S6991XA Unspecified injury of right wrist, hand and finger(s), initial encounter: Secondary | ICD-10-CM

## 2022-08-30 DIAGNOSIS — M79644 Pain in right finger(s): Secondary | ICD-10-CM | POA: Diagnosis not present

## 2022-08-30 DIAGNOSIS — S60021A Contusion of right index finger without damage to nail, initial encounter: Secondary | ICD-10-CM

## 2022-08-30 MED ORDER — NAPROXEN 375 MG PO TABS: 375.0000 mg | ORAL_TABLET | Freq: Two times a day (BID) | ORAL | 0 refills | Status: DC

## 2022-08-30 NOTE — ED Provider Notes (Signed)
[place]    CSN: [id number] Arrival date & time: 08/30/22  [id number]      History   Chief Complaint Chief Complaint  Patient presents with   Finger Injury    HPI Sabrina Poole is a 21 y.o. female.   Patient presents today with a 1 week history of right index finger pain following injury.  Reports that she slammed the proximal portion of the finger in a door jam approximately 1 week ago.  She has had ongoing pain since that time.  Pain is currently rated 8 on a 0-10 pain scale, localized to proximal index finger, described as throbbing/aching, no aggravating or alleviating factors notified.  Denies any change in sensation.  She is able to move it.  She has been using a static finger splint with improvement of symptoms.  She is also tried over-the-counter analgesics with minimal improvement.  She is right-handed.  She is confident that she is not pregnant.    Past Medical History:  Diagnosis Date   ADHD (attention deficit hyperactivity disorder)    Anxiety    mild anxiety   Decreased visual acuity 12/03/2015   Depression    Schizophrenia [facility])     Patient Active Problem List   Diagnosis Date Noted   Positive screening for depression on 9-item Patient Health Questionnaire (PHQ-9) 07/27/2022   Primary oligomenorrhea 07/27/2022   PCOS (polycystic ovarian syndrome) 07/27/2022   Subjective vision disturbance, left eye    Complicated migraine [id number]   Decreased visual acuity 12/03/2015   New daily persistent headache 12/01/2015   Conversion disorder    Hallucinations    Observed seizure-like activity (HCC)    Cerebral calcification 11/11/2015   Mood disorder ([place])    Depression 06/20/2014   ADHD (attention deficit hyperactivity disorder), 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   Acanthosis nigricans 06/20/2014   Body mass index, pediatric, greater than or equal to 95th percentile for age 52/12/2013    Past Surgical History:  Procedure Laterality Date   ADENOIDECTOMY      RADIOLOGY WITH ANESTHESIA N/A 11/12/2015   Procedure: RADIOLOGY WITH ANESTHESIA;  Surgeon: Medication Radiologist, MD;  Location: [place];  Service: Radiology;  Laterality: N/A;   TONSILLECTOMY     TONSILLECTOMY AND ADENOIDECTOMY N/A 02/22/2017   Procedure: TONSILLECTOMY AND ADENOIDECTOMY;  Surgeon: Leta Baptist, MD;  Location: [place];  Service: ENT;  Laterality: N/A;    OB History     Gravida  0   Para      Term      Preterm      AB      Living         SAB      IAB      Ectopic      Multiple      Live Births               Home Medications    Prior to Admission medications   Medication Sig Start Date End Date Taking? Authorizing Provider  naproxen (NAPROSYN) 375 MG tablet Take 1 tablet (375 mg total) by mouth 2 (two) times daily. 08/30/22  Yes Nahiem Dredge K, PA-C  calcium-vitamin D (OSCAL WITH D) 500-200 MG-UNIT tablet Take 1 tablet by mouth. Patient not taking: Reported on 04/06/2021    [provider]  Multiple Vitamin (MULTIVITAMIN) tablet Take 1 tablet by mouth daily. Patient not taking: Reported on 04/06/2021    [provider]  polyethylene glycol powder (GLYCOLAX/MIRALAX) 17 GM/SCOOP powder Mix one  capful in 8 ounces of liquid and drink daily when needed to manage constipation Patient not taking: Reported on 07/27/2022 01/04/22   Lurlean Leyden, MD  risperiDONE (RISPERDAL) 1 MG tablet Take 1 tablet (1 mg total) by mouth 2 (two) times daily. Take a half tablet twice a day for 5 days then increase to a whole tablet twice a day 08/09/22   Briant Cedar, MD    Family History Family History  Problem Relation Age of Onset   Diabetes Mother    Allergic rhinitis Mother    Asthma Mother    Hypertension Maternal Grandmother    Heart disease Maternal Grandmother    Hyperlipidemia Maternal Grandmother    Hypertension Maternal Grandfather    Heart disease Maternal Grandfather    Hyperlipidemia Maternal Grandfather     Hypertension Paternal Grandmother    Heart disease Paternal Grandmother    Hyperlipidemia Paternal Grandmother    Hypertension Paternal Grandfather    Heart disease Paternal Grandfather    Hyperlipidemia Paternal Grandfather     Social History Social History   Tobacco Use   Smoking status: Never   Smokeless tobacco: Never  Vaping Use   Vaping Use: Never used  Substance Use Topics   Alcohol use: No   Drug use: Yes    Types: Marijuana     Allergies   Patient has no known allergies.   Review of Systems Review of Systems  Constitutional:  Positive for activity change. Negative for appetite change, fatigue and fever.  Musculoskeletal:  Positive for arthralgias and joint swelling. Negative for myalgias.  Skin:  Negative for color change and wound.  Neurological:  Negative for weakness and numbness.     Physical Exam Triage Vital Signs ED Triage Vitals  Enc Vitals Group     BP 08/30/22 0905 103/66     Pulse Rate 08/30/22 0905 65     Resp 08/30/22 0905 17     Temp 08/30/22 0905 98.7 F (37.1 C)     Temp Source 08/30/22 0905 Oral     SpO2 08/30/22 0905 97 %     Weight --      Height --      Head Circumference --      Peak Flow --      Pain Score 08/30/22 0903 8     Pain Loc --      Pain Edu? --      Excl. in [place]? --    No data found.  Updated Vital Signs BP 103/66 (BP Location: Left Arm)   Pulse 65   Temp 98.7 F (37.1 C) (Oral)   Resp 17   SpO2 97%   Visual Acuity Right Eye Distance:   Left Eye Distance:   Bilateral Distance:    Right Eye Near:   Left Eye Near:    Bilateral Near:     Physical Exam Vitals reviewed.  Constitutional:      General: She is awake. She is not in acute distress.    Appearance: Normal appearance. She is well-developed. She is not ill-appearing.     Comments: Very pleasant female appears stated age in no acute distress sitting comfortably in exam room  HENT:     Head: Normocephalic and atraumatic.  Cardiovascular:      Rate and Rhythm: Normal rate and regular rhythm.     Heart sounds: Normal heart sounds, S1 normal and S2 normal. No murmur heard.    Comments: Capillary refill within 2 seconds right  index finger Pulmonary:     Effort: Pulmonary effort is normal.     Breath sounds: Normal breath sounds. No wheezing, rhonchi or rales.  Musculoskeletal:     Right hand: Tenderness and bony tenderness present. No swelling or deformity. Normal range of motion. Normal strength. There is no disruption of two-point discrimination. Normal capillary refill.     Comments: Right index finger: Tenderness palpation over proximal finger without deformity.  Normal active range of motion.  Hand neurovascularly intact.  Psychiatric:        Behavior: Behavior is cooperative.      UC Treatments / Results  Labs (all labs ordered are listed, but only abnormal results are displayed) Labs Reviewed - No data to display  EKG   Radiology DG Finger Index Right  Result Date: 08/30/2022 CLINICAL DATA:  Persistent index finger pain following injury 1 week ago. EXAM: RIGHT INDEX FINGER 2+V COMPARISON:  Right hand radiographs 09/10/2013 FINDINGS: The mineralization and alignment are normal. There is no evidence of acute fracture or dislocation. The joint spaces are preserved. Possible soft tissue swelling without evidence of foreign body or soft tissue emphysema. IMPRESSION: No acute osseous findings. Possible soft tissue swelling. Electronically Signed   By: Carey Bullocks M.D.   On: 08/30/2022 09:37    Procedures Procedures (including critical care time)  Medications Ordered in UC Medications - No data to display  Initial Impression / Assessment and Plan / UC Course  I have reviewed the triage vital signs and the nursing notes.  Pertinent labs & imaging results that were available during my care of the patient were reviewed by me and considered in my medical decision making (see chart for details).     X-ray was obtained  given mechanism of injury that showed no acute osseous abnormality.  Discussed likely contusion as etiology of symptoms.  Recommended RICE protocol.  She has a static finger splint and was encouraged to use this as needed for comfort and support.  She was started on Naprosyn twice daily with instruction not to take NSAIDs with this medication.  She can use acetaminophen/Tylenol for pain relief.  Discussed that if symptoms are proving she should follow-up with orthopedics was given contact information for local provider with instruction to call to schedule an appointment.  Discussed that if she has any worsening symptoms including increased pain, swelling, numbness, paresthesias she needs to be seen immediately.  Strict return precautions given.  Work excuse note provided.  Final Clinical Impressions(s) / UC Diagnoses   Final diagnoses:  Contusion of right index finger without damage to nail, initial encounter  Injury of finger of right hand, initial encounter     Discharge Instructions      Your x-ray did not show any fracture which is great news.  Continue using the brace as needed for comfort and support.  Keep it elevated and use ice.  Take Naprosyn twice a day.  Do not take NSAIDs with this medication including aspirin, ibuprofen/Advil, naproxen/Aleve.  You can use Tylenol/acetaminophen with this medication for breakthrough pain.  If you have any worsening or persistent symptoms please follow-up with orthopedics; call to schedule appointment.  If anything worsens suddenly you have decreased range of motion, increased swelling, numbness or tingling you should be seen immediately.     ED Prescriptions     Medication Sig Dispense Auth. Provider   naproxen (NAPROSYN) 375 MG tablet Take 1 tablet (375 mg total) by mouth 2 (two) times daily. 20 tablet Edvardo Honse, Denny Peon  K, PA-C      PDMP not reviewed this encounter.   Terrilee Croak, PA-C 08/30/22 [id number]

## 2022-08-30 NOTE — Discharge Instructions (Signed)
Your x-ray did not show any fracture which is great news.  Continue using the brace as needed for comfort and support.  Keep it elevated and use ice.  Take Naprosyn twice a day.  Do not take NSAIDs with this medication including aspirin, ibuprofen/Advil, naproxen/Aleve.  You can use Tylenol/acetaminophen with this medication for breakthrough pain.  If you have any worsening or persistent symptoms please follow-up with orthopedics; call to schedule appointment.  If anything worsens suddenly you have decreased range of motion, increased swelling, numbness or tingling you should be seen immediately.

## 2022-08-30 NOTE — ED Triage Notes (Signed)
Pt reports slammed right index finger in door week ago. Pt c/o pain in finger still and believes it is broken. Pt states that she is able to move finger.

## 2022-09-15 ENCOUNTER — Ambulatory Visit (HOSPITAL_BASED_OUTPATIENT_CLINIC_OR_DEPARTMENT_OTHER): Payer: Medicaid Other | Admitting: Student in an Organized Health Care Education/Training Program

## 2022-09-15 ENCOUNTER — Encounter (HOSPITAL_COMMUNITY): Payer: Self-pay | Admitting: Student in an Organized Health Care Education/Training Program

## 2022-09-15 DIAGNOSIS — F209 Schizophrenia, unspecified: Secondary | ICD-10-CM

## 2022-09-15 DIAGNOSIS — F411 Generalized anxiety disorder: Secondary | ICD-10-CM | POA: Diagnosis not present

## 2022-09-15 DIAGNOSIS — F339 Major depressive disorder, recurrent, unspecified: Secondary | ICD-10-CM

## 2022-09-15 MED ORDER — ARIPIPRAZOLE 15 MG PO TABS: 15.0000 mg | ORAL_TABLET | Freq: Every day | ORAL | 1 refills | Status: DC

## 2022-09-15 NOTE — Progress Notes (Signed)
BH MD/PA/NP OP Progress Note  09/15/2022 1:25 PM Sabrina Poole Poole  MRN:  [id number]  Chief Complaint:  Chief Complaint  Patient presents with   Follow-up   Schizophrenia   Depression   Anxiety   HPI:  Sabrina Poole is a 21 yr old female who presents for follow up and for medication management. PPHx is significant for Schizophrenia, Depression, Anxiety, and ADHD, Multiple Suicide Attempts (would not discuss reports last was 2-3 yrs ago), and Self Injurious Behavior (cutting/scratching last was 9/23), and no hospitalizations. PMH is significant for Sturge Weber Syndrome, Epilepsy, and Non-Epileptic Seizures.   She reports that she has been doing okay with the Risperdal.  She reports that the medicine is working from the standpoint of ADH and that she has not had any in a while.  However, she reports that she is very sedated from it and has had a significant increase in her appetite.  She reports that she continues to have racing thoughts and occasionally have SI with no plan.  She reports she is having rapid changes of her mood from happy to crying to angry.  Discussed changing her mood stabilizer from Risperdal to Abilify.  Discussed with her that Abilify tends not to have issues with sedation and is the most weight neutral of the antipsychotics.  Discussed with her that Abilify also does have some benefit for depression and anxiety.  Discussed with her that given her continued mood instability it would be unwise to start an antidepressant at this time.  Discussed that this is something we can consider at follow-up appointment.  She reports that she is having significant issues with motivation and so discussed we will consider Prozac.  She reports no SI, HI, or AVH.  She reports her sleep is good.  She reports her appetite is too good.  Discussed with her what to do in the event of a future crisis.  Discussed that [facility], [facility], go to the nearest ED, or call 911 or [phone].   She reported  understanding and had no concerns.    Visit Diagnosis:    ICD-10-CM   1. Schizophrenia, unspecified type (HCC)  F20.9 ARIPiprazole (ABILIFY) 15 MG tablet    2. GAD (generalized anxiety disorder)  F41.1 ARIPiprazole (ABILIFY) 15 MG tablet    3. Episode of recurrent major depressive disorder, unspecified depression episode severity (HCC)  F33.9 ARIPiprazole (ABILIFY) 15 MG tablet      Past Psychiatric History: Schizophrenia, Depression, Anxiety, and ADHD, Multiple Suicide Attempts (would not discuss reports last was 2-3 yrs ago), and Self Injurious Behavior (cutting/scratching last was 9/23), and no hospitalizations   Past Medical History:  Past Medical History:  Diagnosis Date   ADHD (attention deficit hyperactivity disorder)    Anxiety    mild anxiety   Decreased visual acuity 12/03/2015   Depression    Schizophrenia (HCC)     Past Surgical History:  Procedure Laterality Date   ADENOIDECTOMY     RADIOLOGY WITH ANESTHESIA N/A 11/12/2015   Procedure: RADIOLOGY WITH ANESTHESIA;  Surgeon: Medication Radiologist, MD;  Location: [facility] OR;  Service: Radiology;  Laterality: N/A;   TONSILLECTOMY     TONSILLECTOMY AND ADENOIDECTOMY N/A 02/22/2017   Procedure: TONSILLECTOMY AND ADENOIDECTOMY;  Surgeon: Newman Pieseoh, Su, MD;  Location: [facility];  Service: ENT;  Laterality: N/A;    Family Psychiatric History: Father- Not diagnosed but something, EtOH abuse Mother- Panic Disorder No Known Suicides  Family History:  Family History  Problem Relation Age of Onset   Diabetes Mother    Allergic rhinitis Mother    Asthma Mother    Hypertension Maternal Grandmother    Heart disease Maternal Grandmother    Hyperlipidemia Maternal Grandmother    Hypertension Maternal Grandfather    Heart disease Maternal Grandfather    Hyperlipidemia Maternal Grandfather    Hypertension Paternal Grandmother    Heart disease Paternal Grandmother    Hyperlipidemia Paternal Grandmother    Hypertension  Paternal Grandfather    Heart disease Paternal Grandfather    Hyperlipidemia Paternal Grandfather     Social History:  Social History   Socioeconomic History   Marital status: Single    Spouse name: Not on file   Number of children: Not on file   Years of education: Not on file   Highest education level: Not on file  Occupational History   Not on file  Tobacco Use   Smoking status: Never   Smokeless tobacco: Never  Vaping Use   Vaping Use: Never used  Substance and Sexual Activity   Alcohol use: No   Drug use: Yes    Types: Marijuana   Sexual activity: Never  Other Topics Concern   Not on file  Social History Narrative   Sabrina Poole lives with her mother and three younger siblings. She is home-schooled; she does well in school. She enjoys coloring, drawing, and watching movies. No pets in the house; no smokers in the house.   Social Determinants of Health   Financial Resource Strain: Not on file  Food Insecurity: No Food Insecurity (09/08/2019)   Hunger Vital Sign    Worried About Running Out of Food in the Last Year: Never true    Ran Out of Food in the Last Year: Never true  Transportation Needs: Not on file  Physical Activity: Not on file  Stress: Not on file  Social Connections: Not on file    Allergies: No Known Allergies  Metabolic Disorder Labs: Lab Results  Component Value Date   HGBA1C 5.6 10/03/2020   MPG 114 10/03/2020   MPG 114 09/10/2019   Lab Results  Component Value Date   PROLACTIN 1.5 (L) 07/27/2022   PROLACTIN 1.5 (L) 04/06/2021   Lab Results  Component Value Date   CHOL 170 (H) 12/31/2021   TRIG 140 (H) 12/31/2021   HDL 42 (L) 12/31/2021   CHOLHDL 4.0 12/31/2021   VLDL 20 06/20/2014   LDLCALC 104 12/31/2021   LDLCALC 91 10/03/2020   Lab Results  Component Value Date   TSH 1.310 07/27/2022   TSH 1.810 04/06/2021    Therapeutic Level Labs: No results found for: "LITHIUM" No results found for: "VALPROATE" No results found for:  "CBMZ"  Current Medications: Current Outpatient Medications  Medication Sig Dispense Refill   ARIPiprazole (ABILIFY) 15 MG tablet Take 1 tablet (15 mg total) by mouth daily. 30 tablet 1   calcium-vitamin D (OSCAL WITH D) 500-200 MG-UNIT tablet Take 1 tablet by mouth. (Patient not taking: Reported on 04/06/2021)     Multiple Vitamin (MULTIVITAMIN) tablet Take 1 tablet by mouth daily. (Patient not taking: Reported on 04/06/2021)     naproxen (NAPROSYN) 375 MG tablet Take 1 tablet (375 mg total) by mouth 2 (two) times daily. 20 tablet 0   polyethylene glycol powder (GLYCOLAX/MIRALAX) 17 GM/SCOOP powder Mix one capful in 8 ounces of liquid and drink daily when needed to manage constipation (Patient not taking: Reported on 07/27/2022) 506 g 2   No current facility-administered  medications for this visit.     Musculoskeletal: Strength & Muscle Tone: within normal limits Gait & Station: normal Patient leans: N/A  Psychiatric Specialty Exam: Review of Systems  Respiratory:  Negative for shortness of breath.   Cardiovascular:  Negative for chest pain.  Gastrointestinal:  Negative for abdominal pain, constipation, diarrhea, nausea and vomiting.  Neurological:  Negative for dizziness, weakness and headaches.  Psychiatric/Behavioral:  Positive for dysphoric mood. Negative for hallucinations, sleep disturbance and suicidal ideas. The patient is nervous/anxious.     There were no vitals taken for this visit.There is no height or weight on file to calculate BMI.  General Appearance: Casual and Fairly Groomed  Eye Contact:  Fair  Speech:  Clear and Coherent and Normal Rate  Volume:  Normal  Mood:  Anxious and Dysphoric  Affect:  Congruent  Thought Process:  Coherent and Goal Directed  Orientation:  Full (Time, Place, and Person)  Thought Content: WDL and Logical   Suicidal Thoughts:  No  Homicidal Thoughts:  No  Memory:  Immediate;   Good Recent;   Good  Judgement:  Fair  Insight:  Fair   Psychomotor Activity:  Normal  Concentration:  Concentration: Good and Attention Span: Good  Recall:  Good  Fund of Knowledge: Good  Language: Good  Akathisia:  Negative  Handed:  Right  AIMS (if indicated): not done  Assets:  Desire for Improvement Financial Resources/Insurance Housing Resilience Social Support  ADL's:  Intact  Cognition: Impaired,  Mild  Sleep:  Good   Screenings: PHQ2-9    Flowsheet Row Office Visit from 07/27/2022 in [facility] Office Visit from 12/31/2021 in [place] [facility] Office Visit from 10/03/2020 in [facility] Office Visit from 09[facility]07/2019 in [place] [facility] from 07/10/2018 in [place] [facility]  PHQ-2 Total Score 4 5 3 1 3   PHQ-9 Total Score 15 17 9 3 7       Flowsheet Row ED from 08/30/2022 in [facility] RISK CATEGORY No Risk        Assessment and Plan:  Zannah Melucci is a 21 yr old female who presents for follow up and for medication management. PPHx is significant for Schizophrenia, Depression, Anxiety, and ADHD, Multiple Suicide Attempts (would not discuss reports last was 2-3 yrs ago), and Self Injurious Behavior (cutting/scratching last was 9/23), and no hospitalizations. PMH is significant for Sturge Weber Syndrome, Epilepsy, and Non-Epileptic Seizures.    Dajanay has responded well to the Risperdal, however, she is having significant issues with sedation and significantly increased appetite from it.  Due to this we will transition off the Risperdal and onto Abilify.  The equivalent dosing is approximately 15 mg of Abilify so this is where we will start.  We will not start an antidepressant at this time due to continued concerns of mood stability and also because there will be  some antidepressant/antianxiolytic benefit from the Abilify.  We will consider starting an antidepressant at our next appointment (Prozac?).  She will return for follow-up in approximately 4 weeks.   Schizophrenia  MDD  GAD  (possible Schizoaffective Disorder): -Start Abilify 15 mg daily for psychosis and depression and anxiety.  30 tablets with 1 refill. -We will not start an antidepressant at this time.   Collaboration  of Care:   Patient/Guardian was advised Release of Information must be obtained prior to any record release in order to collaborate their care with an outside provider. Patient/Guardian was advised if they have not already done so to contact the registration department to sign all necessary forms in order for [place] to release information regarding their care.   Consent: Patient/Guardian gives verbal consent for treatment and assignment of benefits for services provided during this visit. Patient/Guardian expressed understanding and agreed to proceed.    Lauro Franklin, MD 09/15/2022, 1:25 PM

## 2022-09-27 ENCOUNTER — Ambulatory Visit (INDEPENDENT_AMBULATORY_CARE_PROVIDER_SITE_OTHER): Payer: Medicaid Other | Admitting: Student

## 2022-09-27 ENCOUNTER — Encounter: Payer: Self-pay | Admitting: Student

## 2022-09-27 VITALS — BP 122/71 | HR 78 | Ht 65.0 in | Wt 208.6 lb

## 2022-09-27 DIAGNOSIS — N913 Primary oligomenorrhea: Secondary | ICD-10-CM | POA: Diagnosis not present

## 2022-09-27 MED ORDER — LO LOESTRIN FE 1 MG-10 MCG / 10 MCG PO TABS: 1.0000 | ORAL_TABLET | Freq: Every day | ORAL | 4 refills | Status: DC

## 2022-09-27 NOTE — Progress Notes (Signed)
History:  Sabrina Poole is a 21 y.o. G0P0 who presents to clinic today for follow-up for  oligomenorrhea and history of reported PCOS. Pt reports that her abdominal pain hasn't changed and still having irregular cycles. Pt took OCP for 2 weeks, then stopped because "it made her feel crazy". Pt would like to try another birth control pill lower in hormones. No other concerns at this time. Patient is not sexually active.   The following portions of the patient's history were reviewed and updated as appropriate: allergies, current medications, family history, past medical history, social history, past surgical history and problem list.  Review of Systems:  Review of Systems  Constitutional:  Negative for chills, fever and weight loss.  Gastrointestinal:  Negative for abdominal pain, nausea and vomiting.  Genitourinary:  Negative for dysuria, frequency and urgency.  Skin:  Negative for rash.  Neurological:  Negative for dizziness and headaches.  Psychiatric/Behavioral:  Negative for depression.       Objective:  Physical Exam BP 122/71   Pulse 78   Ht 5\' 5"  (1.651 m)   Wt 208 lb 9.6 oz (94.6 kg)   LMP 09/01/2022 (Approximate)   BMI 34.71 kg/m  Physical Exam Constitutional:      Appearance: Normal appearance.  Cardiovascular:     Rate and Rhythm: Normal rate.  Pulmonary:     Effort: Pulmonary effort is normal.  Abdominal:     Palpations: Abdomen is soft.  Genitourinary:    Comments: deferred Skin:    General: Skin is warm and dry.  Neurological:     Mental Status: She is alert and oriented to person, place, and time. Mental status is at baseline.  Psychiatric:        Mood and Affect: Mood normal.        Behavior: Behavior normal.     Labs and Imaging  CLINICAL DATA:  Primary amenorrhea, acute pelvic pain. The patient has a history of polycystic ovarian syndrome and irregular menses.   EXAM: TRANSABDOMINAL ULTRASOUND OF PELVIS   TECHNIQUE: Transabdominal  ultrasound examination of the pelvis was performed including evaluation of the uterus, ovaries, adnexal regions, and pelvic cul-de-sac.   COMPARISON:  Abdominal radiograph dated 06/04/2013.   FINDINGS: Uterus   Measurements: 8.0 x 3.4 x 4.9 cm = volume: 70 mL. No fibroids or other mass visualized.   Endometrium   Thickness: 7 mm.  No focal abnormality visualized.   Right ovary   Measurements: 3.3 x 1.7 x 2.8 cm = volume: 8 mL. Multiple follicles are noted.   Left ovary   Not visualized.   Other findings:  No abnormal free fluid.   IMPRESSION: 1. No findings to explain the patient's symptoms.    Recent Results (from the past 2160 hour(s))  Cervicovaginal ancillary only( [place])     Status: None   Collection Time: 07/27/22 11:32 AM  Result Value Ref Range   Neisseria Gonorrhea Negative    Chlamydia Negative    Trichomonas Negative    Bacterial Vaginitis (gardnerella) Negative    Candida Vaginitis Negative    Candida Glabrata Negative    Comment      Normal Reference Range Bacterial Vaginosis - Negative   Comment Normal Reference Range Candida Species - Negative    Comment Normal Reference Range Candida Galbrata - Negative    Comment Normal Reference Range Trichomonas - Negative    Comment Normal Reference [place] Chlamydia - Negative    Comment      Normal Reference  Range Neisseria Gonorrhea - Negative  HIV antibody (with reflex)     Status: None   Collection Time: 07/27/22 11:38 AM  Result Value Ref Range   HIV Screen 4th Generation wRfx Non Reactive Non Reactive    Comment: HIV Negative HIV-1/HIV-2 antibodies and HIV-1 p24 antigen were NOT detected. There is no laboratory evidence of HIV infection.   Hepatitis C Antibody     Status: None   Collection Time: 07/27/22 11:38 AM  Result Value Ref Range   Hep C Virus Ab Non Reactive Non Reactive    Comment: HCV antibody alone does not differentiate between previously resolved infection and active infection.  Equivocal and Reactive HCV antibody results should be followed up with an HCV RNA test to support the diagnosis of active HCV infection.   Hepatitis B Surface AntiGEN     Status: None   Collection Time: 07/27/22 11:38 AM  Result Value Ref Range   Hepatitis B Surface Ag Negative Negative  RPR     Status: None   Collection Time: 07/27/22 11:38 AM  Result Value Ref Range   RPR Ser Ql Non Reactive Non Reactive  TSH     Status: None   Collection Time: 07/27/22 11:38 AM  Result Value Ref Range   TSH 1.310 0.450 - 4.500 uIU/mL  FSH     Status: None   Collection Time: 07/27/22 11:38 AM  Result Value Ref Range   FSH 2.3 mIU/mL    Comment:                     Adult Female:                       Follicular phase      3.5 -  12.5                       Ovulation phase       4.7 -  21.5                       Luteal phase          1.7 -   7.7                       Postmenopausal       25.8 - 134.8   LH     Status: None   Collection Time: 07/27/22 11:38 AM  Result Value Ref Range   LH 11.7 mIU/mL    Comment:                     Adult Female:                       Follicular phase      2.4 -  12.6                       Ovulation phase      14.0 -  95.6                       Luteal phase          1.0 -  11.4                       Postmenopausal        7.7 -  58.5   Prolactin     Status: Abnormal   Collection Time: 07/27/22 11:38 AM  Result Value Ref Range   Prolactin 1.5 (L) 4.8 - 23.3 ng/mL  Testosterone     Status: None   Collection Time: 07/27/22 11:38 AM  Result Value Ref Range   Testosterone 40 13 - 71 ng/dL     Health Maintenance Due  Topic Date Due   COVID-19 Vaccine (1) Never done   DTaP/Tdap/Td (6 - Tdap) 03/31/2013   HPV VACCINES (1 - 2-dose series) Never done   INFLUENZA VACCINE  05/18/2022    Labs, imaging and previous visits in Epic and Care Everywhere reviewed  Assessment & Plan:  1. Primary oligomenorrhea - Reassuring lab values and imagine from prior visit.  -  Discussed option for different OCP with lower hormonal levels.Risks and benefits reviewed.  Questions were answered.    - LO LOESTRIN FE 1 MG-10 MCG / 10 MCG tablet; Take 1 tablet by mouth daily.  Dispense: 60 tablet; Refill: 4 - Plan for follow-up in 3 months to assess symptoms. Counseled on potential option to initiate continuous OCP therapy at that time.   Approximately 15 minutes of total time was spent with this patient on counseling and coordination of care.   Corlis Hove, NP 09/27/2022 9:14 AM

## 2022-09-27 NOTE — Progress Notes (Signed)
Patient presents for follow-up for AUB. Pt reports that her abdominal pain hasn't changed. She also reports her periods are still irregular. Pt took OCP for 2 weeks, then stopped because "it made her feel crazy". Pt would like to try another birth control pill lower in hormones. No other concerns at this time.

## 2022-10-13 ENCOUNTER — Encounter (HOSPITAL_COMMUNITY): Payer: Self-pay | Admitting: Student in an Organized Health Care Education/Training Program

## 2022-10-13 ENCOUNTER — Ambulatory Visit (HOSPITAL_BASED_OUTPATIENT_CLINIC_OR_DEPARTMENT_OTHER): Payer: Medicaid Other | Admitting: Student in an Organized Health Care Education/Training Program

## 2022-10-13 VITALS — BP 113/74 | HR 95 | Ht 65.0 in | Wt 208.2 lb

## 2022-10-13 DIAGNOSIS — F339 Major depressive disorder, recurrent, unspecified: Secondary | ICD-10-CM

## 2022-10-13 DIAGNOSIS — F411 Generalized anxiety disorder: Secondary | ICD-10-CM

## 2022-10-13 DIAGNOSIS — F209 Schizophrenia, unspecified: Secondary | ICD-10-CM | POA: Diagnosis not present

## 2022-10-13 MED ORDER — ARIPIPRAZOLE 15 MG PO TABS: 15.0000 mg | ORAL_TABLET | Freq: Every day | ORAL | 1 refills | Status: DC

## 2022-10-13 NOTE — Progress Notes (Cosign Needed)
BH MD/PA/NP OP Progress Note  10/13/2022 1:29 PM Sabrina Poole  MRN:  [id number]  Chief Complaint:  Chief Complaint  Patient presents with   Follow-up   Depression   Anxiety   Schizophrenia   HPI:  Sabrina Poole is a 21 yr old female who presents for follow up and for medication management. PPHx is significant for Schizophrenia, Depression, Anxiety, and ADHD, Multiple Suicide Attempts (would not discuss reports last was 2-3 yrs ago), and Self Injurious Behavior (cutting/scratching last was 9/23), and no hospitalizations. PMH is significant for Sturge Weber Syndrome, Epilepsy, and Non-Epileptic Seizures.   She reports that she has had a significant improvement since her last appointment.  When asked how the Abilify has gone she reports "I love it."  She reports that she has been depressed and anxious for so long and overthinking things but now she is happy and at peace.  She reports that her boyfriend has noticed a significant improvement as well.  She reports that she has only had 3 breakdowns since her last appointment and these happened near when she started the Abilify and has not had any since when they used to be a daily occurrence.  She reports no side effects to the Abilify.  When asked how her sleep is doing she reported "awesome."  When asked if she was still overeating like she had been with the Risperdal she reports it has gone back to normal.  She reports no side effects to the Abilify at all.  Discussed with her that we would not change anything and keep it the same and she was agreeable with this.  She reports no SI, HI, or AVH.  She reports her sleep is good.  She reports her appetite is good.  She reports no other concerns at present.  She will return for follow-up approximately 6 weeks.   Visit Diagnosis:    ICD-10-CM   1. Schizophrenia, unspecified type (HCC)  F20.9 ARIPiprazole (ABILIFY) 15 MG tablet    2. GAD (generalized anxiety disorder)  F41.1 ARIPiprazole (ABILIFY) 15  MG tablet    3. Episode of recurrent major depressive disorder, unspecified depression episode severity (HCC)  F33.9 ARIPiprazole (ABILIFY) 15 MG tablet      Past Psychiatric History: Schizophrenia, Depression, Anxiety, and ADHD, Multiple Suicide Attempts (would not discuss reports last was 2-3 yrs ago), and Self Injurious Behavior (cutting/scratching last was 9/23), and no hospitalizations.  Past Medical History:  Past Medical History:  Diagnosis Date   ADHD (attention deficit hyperactivity disorder)    Anxiety    mild anxiety   Decreased visual acuity 12/03/2015   Depression    Schizophrenia (HCC)     Past Surgical History:  Procedure Laterality Date   ADENOIDECTOMY     RADIOLOGY WITH ANESTHESIA N/A 11/12/2015   Procedure: RADIOLOGY WITH ANESTHESIA;  Surgeon: Medication Radiologist, MD;  Location: [facility];  Service: Radiology;  Laterality: N/A;   TONSILLECTOMY     TONSILLECTOMY AND ADENOIDECTOMY N/A 02/22/2017   Procedure: TONSILLECTOMY AND ADENOIDECTOMY;  Surgeon: Newman Pies, MD;  Location: [facility];  Service: ENT;  Laterality: N/A;    Family Psychiatric History: Father- Not diagnosed but something, EtOH abuse Mother- Panic Disorder No Known Suicides  Family History:  Family History  Problem Relation Age of Onset   Diabetes Mother    Allergic rhinitis Mother    Asthma Mother    Hypertension Maternal Grandmother    Heart disease Maternal Grandmother    Hyperlipidemia Maternal Grandmother  Hypertension Maternal Grandfather    Heart disease Maternal Grandfather    Hyperlipidemia Maternal Grandfather    Hypertension Paternal Grandmother    Heart disease Paternal Grandmother    Hyperlipidemia Paternal Grandmother    Hypertension Paternal Grandfather    Heart disease Paternal Grandfather    Hyperlipidemia Paternal Grandfather     Social History:  Social History   Socioeconomic History   Marital status: Single    Spouse name: Not on file   Number of  children: Not on file   Years of education: Not on file   Highest education level: Not on file  Occupational History   Not on file  Tobacco Use   Smoking status: Never   Smokeless tobacco: Never  Vaping Use   Vaping Use: Never used  Substance and Sexual Activity   Alcohol use: No   Drug use: Yes    Types: Marijuana   Sexual activity: Never  Other Topics Concern   Not on file  Social History Narrative   Alleah lives with her mother and three younger siblings. She is home-schooled; she does well in school. She enjoys coloring, drawing, and watching movies. No pets in the house; no smokers in the house.   Social Determinants of Health   Financial Resource Strain: Not on file  Food Insecurity: No Food Insecurity (09/08/2019)   Hunger Vital Sign    Worried About Running Out of Food in the Last Year: Never true    Ran Out of Food in the Last Year: Never true  Transportation Needs: Not on file  Physical Activity: Not on file  Stress: Not on file  Social Connections: Not on file    Allergies: No Known Allergies  Metabolic Disorder Labs: Lab Results  Component Value Date   HGBA1C 5.6 10/03/2020   MPG 114 10/03/2020   MPG 114 09/10/2019   Lab Results  Component Value Date   PROLACTIN 1.5 (L) 07/27/2022   PROLACTIN 1.5 (L) 04/06/2021   Lab Results  Component Value Date   CHOL 170 (H) 12/31/2021   TRIG 140 (H) 12/31/2021   HDL 42 (L) 12/31/2021   CHOLHDL 4.0 12/31/2021   VLDL 20 06/20/2014   LDLCALC 104 12/31/2021   LDLCALC 91 10/03/2020   Lab Results  Component Value Date   TSH 1.310 07/27/2022   TSH 1.810 04/06/2021    Therapeutic Level Labs: No results found for: "LITHIUM" No results found for: "VALPROATE" No results found for: "CBMZ"  Current Medications: Current Outpatient Medications  Medication Sig Dispense Refill   ARIPiprazole (ABILIFY) 15 MG tablet Take 1 tablet (15 mg total) by mouth daily. 30 tablet 1   calcium-vitamin D (OSCAL WITH D) 500-200  MG-UNIT tablet Take 1 tablet by mouth. (Patient not taking: Reported on 04/06/2021)     LO LOESTRIN FE 1 MG-10 MCG / 10 MCG tablet Take 1 tablet by mouth daily. 60 tablet 4   Multiple Vitamin (MULTIVITAMIN) tablet Take 1 tablet by mouth daily. (Patient not taking: Reported on 04/06/2021)     naproxen (NAPROSYN) 375 MG tablet Take 1 tablet (375 mg total) by mouth 2 (two) times daily. 20 tablet 0   polyethylene glycol powder (GLYCOLAX/MIRALAX) 17 GM/SCOOP powder Mix one capful in 8 ounces of liquid and drink daily when needed to manage constipation (Patient not taking: Reported on 07/27/2022) 506 g 2   No current facility-administered medications for this visit.     Musculoskeletal: Strength & Muscle Tone: within normal limits Gait & Station: normal Patient  leans: N/A  Psychiatric Specialty Exam: Review of Systems  Respiratory:  Negative for shortness of breath.   Cardiovascular:  Negative for chest pain.  Gastrointestinal:  Negative for abdominal pain, constipation, diarrhea, nausea and vomiting.  Neurological:  Negative for dizziness, weakness and headaches.  Psychiatric/Behavioral:  Negative for dysphoric mood, hallucinations, sleep disturbance and suicidal ideas. The patient is not nervous/anxious.     Blood pressure 113/74, pulse 95, height 5\' 5"  (1.651 m), weight 208 lb 3.2 oz (94.4 kg), last menstrual period 09/01/2022, SpO2 98 %.Body mass index is 34.65 kg/m.  General Appearance: Casual and Fairly Groomed  Eye Contact:  Fair  Speech:  Clear and Coherent and Normal Rate  Volume:  Normal  Mood:   "good"  Affect:  Appropriate, Congruent, and upbeat  Thought Process:  Coherent and Goal Directed  Orientation:  Full (Time, Place, and Person)  Thought Content: WDL and Logical   Suicidal Thoughts:  No  Homicidal Thoughts:  No  Memory:  Immediate;   Good Recent;   Good  Judgement:  Good  Insight:  Good  Psychomotor Activity:  Normal  Concentration:  Concentration: Good and  Attention Span: Good  Recall:  Good  Fund of Knowledge: Good  Language: Good  Akathisia:  Negative  Handed:  Right  AIMS (if indicated): done AIMS = 0  Assets:  Desire for Improvement Financial Resources/Insurance Housing Resilience Social Support  ADL's:  Intact  Cognition: Impaired,  Mild  Sleep:  Good   Screenings: PHQ2-9    Flowsheet Row [facility] [place] [facility] Office Visit from 10/03/2020 in [facility] Office Visit from 09/07/2019 in [place] [facility]10/2018 [facility] [place] [facility]  PHQ-2 Total Score 4 5 3 1 3   PHQ-9 Total Score 15 17 9 3 7       [facility]  C-SSRS RISK CATEGORY No Risk        Assessment and Plan:  Sabrina GuardianOlivia Poole is a 21 yr old female who presents for follow up and for medication management. PPHx is significant for Schizophrenia, Depression, Anxiety, and ADHD, Multiple Suicide Attempts (would not discuss reports last was 2-3 yrs ago), and Self Injurious Behavior (cutting/scratching last was 9/23), and no hospitalizations. PMH is significant for Sturge Weber Syndrome, Epilepsy, and Non-Epileptic Seizures.    Sabrina ScaleOlivia has had significant improvement in her symptoms since starting the Abilify.  She is having no side effects to it.  We will not make any changes to her medications at this time.  She will return for follow-up in approximately 6 weeks.   Schizophrenia  MDD  GAD  (possible Schizoaffective Disorder): -Continue Abilify 15 mg daily for psychosis and depression and anxiety.  30 tablets with 1 refill. -We will not start an antidepressant at this time as she has had  improvement.   Collaboration of Care:   Patient/Poole was advised Release of Information must be obtained prior to any record release in order to collaborate their care with an outside provider. Patient/Poole was advised if they have not already done so to contact the registration department to sign all necessary forms in order for us to release information regarding their care.  Consent: Patient/Poole gives verbal consent for treatment and assignment of benefits for services provided during this visit. Patient/Poole expressed understanding and agreed to proceed.    Lauro Franklin, MD 10/13/2022, 1:29 PM

## 2022-10-14 NOTE — Addendum Note (Signed)
Addended by: Lauro Franklin on: 10/14/2022 07:19 AM   Modules accepted: Level of Service

## 2022-11-24 ENCOUNTER — Ambulatory Visit (HOSPITAL_BASED_OUTPATIENT_CLINIC_OR_DEPARTMENT_OTHER): Payer: Medicaid Other | Admitting: Student in an Organized Health Care Education/Training Program

## 2022-11-24 ENCOUNTER — Encounter (HOSPITAL_COMMUNITY): Payer: Self-pay | Admitting: Student in an Organized Health Care Education/Training Program

## 2022-11-24 VITALS — BP 132/78 | HR 71 | Ht 65.0 in | Wt 214.2 lb

## 2022-11-24 DIAGNOSIS — F411 Generalized anxiety disorder: Secondary | ICD-10-CM

## 2022-11-24 DIAGNOSIS — F339 Major depressive disorder, recurrent, unspecified: Secondary | ICD-10-CM | POA: Diagnosis not present

## 2022-11-24 DIAGNOSIS — F209 Schizophrenia, unspecified: Secondary | ICD-10-CM | POA: Diagnosis not present

## 2022-11-24 MED ORDER — ARIPIPRAZOLE 15 MG PO TABS: 15.0000 mg | ORAL_TABLET | Freq: Every day | ORAL | 2 refills | Status: DC

## 2022-11-24 NOTE — Addendum Note (Signed)
Addended by: Briant Cedar on: 11/24/2022 01:31 PM   Modules accepted: Level of Service

## 2022-11-24 NOTE — Progress Notes (Addendum)
BH MD/PA/NP OP Progress Note  11/24/2022 1:26 PM Sabrina Poole  MRN:  [id number]  Chief Complaint:  Chief Complaint  Patient presents with   Follow-up   Medication Refill   HPI:  Sabrina Poole is a 22 yr old female who presents for follow up and for medication management. PPHx is significant for Schizophrenia, Depression, Anxiety, and ADHD, Multiple Suicide Attempts (would not discuss reports last was 2-3 yrs ago), and Self Injurious Behavior (cutting/scratching last was 9/23), and no hospitalizations. PMH is significant for Sturge Weber Syndrome, Epilepsy, and Non-Epileptic Seizures.     She reports that she has continued to do well since her last appointment.  She reports that the Abilify continues to manage all of her symptoms.  She reports she is only had 2 breakdowns since her last appointment (approximately 1 month) which is significant improvement as she used to have a couple a week.  She reports no side effects to her medication.  She reports her mood is stable.  Discussed whether she thought she needed an antidepressant at this point and she reports that she does not feel like she does and that she is doing well.  Discussed we would not make any changes to her medications at this time and she was agreeable with this.  She reports no SI, HI, or AVH.  She reports her sleep is good.  She reports her appetite is doing good.  She reports no other concerns at present.  She will return for follow-up approximately 3 months.  Visit Diagnosis:    ICD-10-CM   1. Schizophrenia, unspecified type ([place])  F20.9 ARIPiprazole (ABILIFY) 15 MG tablet    2. GAD (generalized anxiety disorder)  F41.1 ARIPiprazole (ABILIFY) 15 MG tablet    3. Episode of recurrent major depressive disorder, unspecified depression episode severity (HCC)  F33.9 ARIPiprazole (ABILIFY) 15 MG tablet      Past Psychiatric History: Schizophrenia, Depression, Anxiety, and ADHD, Multiple Suicide Attempts (would not discuss reports  last was 2-3 yrs ago), and Self Injurious Behavior (cutting/scratching last was 9/23), and no hospitalizations. PMH is significant for Sturge Weber Syndrome, Epilepsy, and Non-Epileptic Seizures.   Past Medical History:  Past Medical History:  Diagnosis Date   ADHD (attention deficit hyperactivity disorder)    Anxiety    mild anxiety   Decreased visual acuity 12/03/2015   Depression    Schizophrenia ([place])     Past Surgical History:  Procedure Laterality Date   ADENOIDECTOMY     RADIOLOGY WITH ANESTHESIA N/A 11/12/2015   Procedure: RADIOLOGY WITH ANESTHESIA;  Surgeon: Medication Radiologist, MD;  Location: [place];  Service: Radiology;  Laterality: N/A;   TONSILLECTOMY     TONSILLECTOMY AND ADENOIDECTOMY N/A 02/22/2017   Procedure: TONSILLECTOMY AND ADENOIDECTOMY;  Surgeon: Leta Baptist, MD;  Location: [place];  Service: ENT;  Laterality: N/A;    Family Psychiatric History: Father- Not diagnosed but something, EtOH abuse Mother- Panic Disorder No Known Suicides  Family History:  Family History  Problem Relation Age of Onset   Diabetes Mother    Allergic rhinitis Mother    Asthma Mother    Hypertension Maternal Grandmother    Heart disease Maternal Grandmother    Hyperlipidemia Maternal Grandmother    Hypertension Maternal Grandfather    Heart disease Maternal Grandfather    Hyperlipidemia Maternal Grandfather    Hypertension Paternal Grandmother    Heart disease Paternal Grandmother    Hyperlipidemia Paternal Grandmother    Hypertension Paternal Grandfather    Heart  disease Paternal Grandfather    Hyperlipidemia Paternal Grandfather     Social History:  Social History   Socioeconomic History   Marital status: Single    Spouse name: Not on file   Number of children: Not on file   Years of education: Not on file   Highest education level: Not on file  Occupational History   Not on file  Tobacco Use   Smoking status: Never   Smokeless tobacco: Never   Vaping Use   Vaping Use: Never used  Substance and Sexual Activity   Alcohol use: No   Drug use: Yes    Types: Marijuana   Sexual activity: Never  Other Topics Concern   Not on file  Social History Narrative   Annarose lives with her mother and three younger siblings. She is home-schooled; she does well in school. She enjoys coloring, drawing, and watching movies. No pets in the house; no smokers in the house.   Social Determinants of Health   Financial Resource Strain: Not on file  Food Insecurity: No Food Insecurity (09/08/2019)   Hunger Vital Sign    Worried About Running Out of Food in the Last Year: Never true    Ran Out of Food in the Last Year: Never true  Transportation Needs: Not on file  Physical Activity: Not on file  Stress: Not on file  Social Connections: Not on file    Allergies: No Known Allergies  Metabolic Disorder Labs: Lab Results  Component Value Date   HGBA1C 5.6 10/03/2020   MPG 114 10/03/2020   MPG 114 09/10/2019   Lab Results  Component Value Date   PROLACTIN 1.5 (L) 07/27/2022   PROLACTIN 1.5 (L) 04/06/2021   Lab Results  Component Value Date   CHOL 170 (H) 12/31/2021   TRIG 140 (H) 12/31/2021   HDL 42 (L) 12/31/2021   CHOLHDL 4.0 12/31/2021   VLDL 20 06/20/2014   LDLCALC 104 12/31/2021   LDLCALC 91 10/03/2020   Lab Results  Component Value Date   TSH 1.310 07/27/2022   TSH 1.810 04/06/2021    Therapeutic Level Labs: No results found for: "LITHIUM" No results found for: "VALPROATE" No results found for: "CBMZ"  Current Medications: Current Outpatient Medications  Medication Sig Dispense Refill   ARIPiprazole (ABILIFY) 15 MG tablet Take 1 tablet (15 mg total) by mouth daily. 30 tablet 2   calcium-vitamin D (OSCAL WITH D) 500-200 MG-UNIT tablet Take 1 tablet by mouth. (Patient not taking: Reported on 04/06/2021)     LO LOESTRIN FE 1 MG-10 MCG / 10 MCG tablet Take 1 tablet by mouth daily. 60 tablet 4   Multiple Vitamin  (MULTIVITAMIN) tablet Take 1 tablet by mouth daily. (Patient not taking: Reported on 04/06/2021)     naproxen (NAPROSYN) 375 MG tablet Take 1 tablet (375 mg total) by mouth 2 (two) times daily. 20 tablet 0   polyethylene glycol powder (GLYCOLAX/MIRALAX) 17 GM/SCOOP powder Mix one capful in 8 ounces of liquid and drink daily when needed to manage constipation (Patient not taking: Reported on 07/27/2022) 506 g 2   No current facility-administered medications for this visit.     Musculoskeletal: Strength & Muscle Tone: within normal limits Gait & Station: normal Patient leans: N/A  Psychiatric Specialty Exam: Review of Systems  Respiratory:  Negative for shortness of breath.   Cardiovascular:  Negative for chest pain.  Gastrointestinal:  Negative for abdominal pain, constipation, diarrhea, nausea and vomiting.  Neurological:  Negative for dizziness, weakness and  headaches.  Psychiatric/Behavioral:  Negative for dysphoric mood, hallucinations, sleep disturbance and suicidal ideas. The patient is not nervous/anxious.     Blood pressure 132/78, pulse 71, height 5\' 5"  (1.651 m), weight 214 lb 3.2 oz (97.2 kg), SpO2 98 %.Body mass index is 35.64 kg/m.  General Appearance: Casual and Fairly Groomed  Eye Contact:  Fair  Speech:  Clear and Coherent and Normal Rate  Volume:  Normal  Mood:   "good"  Affect:  Appropriate and Congruent  Thought Process:  Coherent and Goal Directed  Orientation:  Full (Time, Place, and Person)  Thought Content: WDL and Logical   Suicidal Thoughts:  No  Homicidal Thoughts:  No  Memory:  Immediate;   Good Recent;   Good  Judgement:  Good  Insight:  Good  Psychomotor Activity:  Normal  Concentration:  Concentration: Good and Attention Span: Good  Recall:  Good  Fund of Knowledge: Good  Language: Good  Akathisia:  Negative  Handed:  Right  AIMS (if indicated): not done  Assets:  Communication Skills Desire for Improvement Financial  Resources/Insurance Housing Resilience Social Support  ADL's:  Intact  Cognition: WNL  Sleep:  Good   Screenings: PHQ2-9    [place] Office Visit from [facility]/27/2022 in [facility] [place] from 12/31/2021 in [place] [facility] [place] [facility] Office Visit from 10/03/2020 in [place] [facility] [place] Office Visit from 09/07/2019 in [place] for [place] from 07/10/2018 in [place] for [place]  PHQ-2 Total Score 4 5 3 1 3   PHQ-9 Total Score 15 17 9 3 7       Flowsheet Row ED from 08/30/2022 in [facility] [place] No Risk        Assessment and Plan:  Pricila Bridge is a 22 yr old female who presents for follow up and for medication management. PPHx is significant for Schizophrenia, Depression, Anxiety, and ADHD, Multiple Suicide Attempts (would not discuss reports last was 2-3 yrs ago), and Self Injurious Behavior (cutting/scratching last was 9/23), and no hospitalizations. PMH is significant for Sturge Weber Syndrome, Epilepsy, and Non-Epileptic Seizures.     Jerald continues to do well on her current Abilify dose.  We will not make any changes to her medications at this time.  We will send in refills.  She will return for follow-up in approximately 3 months.   Schizophrenia  MDD  GAD  (possible Schizoaffective Disorder): -Continue Abilify 15 mg daily for psychosis and depression and anxiety.  30 tablets with 2 refill.    Collaboration of Care:   Patient/Guardian was advised Release of Information must be obtained prior to any record release in order to collaborate their care with an outside provider. Patient/Guardian was advised if they have not already done so to contact the registration department to sign  all necessary forms in order for [place] to release information regarding their care.   Consent: Patient/Guardian gives verbal consent for treatment and assignment of benefits for services provided during this visit. Patient/Guardian expressed understanding and agreed to proceed.    Briant Cedar, MD 11/24/2022, 1:26 PM

## 2022-11-24 NOTE — Addendum Note (Signed)
Addended by: Charlette Caffey on: 11/24/2022 02:16 PM   Modules accepted: Level of Service

## 2023-01-12 ENCOUNTER — Ambulatory Visit (HOSPITAL_COMMUNITY)
Admission: EM | Admit: 2023-01-12 | Discharge: 2023-01-12 | Disposition: A | Discharge status: Home / Self Care | Payer: Medicaid Other | Attending: Emergency Medicine | Admitting: Emergency Medicine

## 2023-01-12 ENCOUNTER — Encounter (HOSPITAL_COMMUNITY): Payer: Self-pay

## 2023-01-12 DIAGNOSIS — R6884 Jaw pain: Secondary | ICD-10-CM

## 2023-01-12 DIAGNOSIS — M25511 Pain in right shoulder: Secondary | ICD-10-CM

## 2023-01-12 MED ORDER — NAPROXEN 500 MG PO TABS: 500.0000 mg | ORAL_TABLET | Freq: Two times a day (BID) | ORAL | 0 refills | Status: DC

## 2023-01-12 MED ORDER — METHOCARBAMOL 500 MG PO TABS: 500.0000 mg | ORAL_TABLET | Freq: Two times a day (BID) | ORAL | 0 refills | Status: DC | PRN

## 2023-01-12 NOTE — ED Triage Notes (Signed)
Jaw and shoulder pain random onset 3-4 days ago. No known injuries or falls. Right shoulder and right jaw pain. No history of any problems.

## 2023-01-12 NOTE — ED Provider Notes (Signed)
[facility]    CSN: [id number] Arrival date & time: 01/12/23  0801      History   Chief Complaint Chief Complaint  Patient presents with   Jaw Pain        Shoulder Pain    HPI Sabrina Poole is a 22 y.o. female.   Patient presents to clinic with her mother for complaints of right jaw and right shoulder and ear pain that started 2 days prior.  She is unclear what she was doing when the pain started, reports like it feels like someone is ripping her jaw out on the right side.  Does have her wisdom teeth, has not yet had these removed.  Reports tenderness to gentle palpation.  Denies any ear drainage.  Denies recent illness, facial swelling, fever, chest pain or shortness of breath.  Reports restricted range of motion with right shoulder.    The history is provided by the patient and medical records.  Shoulder Pain Associated symptoms: no fatigue and no fever     Past Medical History:  Diagnosis Date   ADHD (attention deficit hyperactivity disorder)    Anxiety    mild anxiety   Decreased visual acuity 12/03/2015   Depression    Schizophrenia [facility])     Patient Active Problem List   Diagnosis Date Noted   Positive screening for depression on 9-item Patient Health Questionnaire (PHQ-9) 07/27/2022   Primary oligomenorrhea 07/27/2022   PCOS (polycystic ovarian syndrome) 07/27/2022   Subjective vision disturbance, left eye    Complicated migraine [id number]   Decreased visual acuity 12/03/2015   New daily persistent headache 12/01/2015   Conversion disorder    Hallucinations    Observed seizure-like activity (HCC)    Cerebral calcification 11/11/2015   Mood disorder ([place])    Depression 06/20/2014   ADHD (attention deficit hyperactivity disorder), 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   Acanthosis nigricans 06/20/2014   Body mass index, pediatric, greater than or equal to 95th percentile for age 68/12/2013    Past Surgical History:  Procedure Laterality Date    ADENOIDECTOMY     RADIOLOGY WITH ANESTHESIA N/A 11/12/2015   Procedure: RADIOLOGY WITH ANESTHESIA;  Surgeon: Medication Radiologist, MD;  Location: [facility];  Service: Radiology;  Laterality: N/A;   TONSILLECTOMY     TONSILLECTOMY AND ADENOIDECTOMY N/A 02/22/2017   Procedure: TONSILLECTOMY AND ADENOIDECTOMY;  Surgeon: Leta Baptist, MD;  Location: [facility];  Service: ENT;  Laterality: N/A;    OB History     Gravida  0   Para      Term      Preterm      AB      Living         SAB      IAB      Ectopic      Multiple      Live Births               Home Medications    Prior to Admission medications   Medication Sig Start Date End Date Taking? Authorizing Provider  ARIPiprazole (ABILIFY) 15 MG tablet Take 1 tablet (15 mg total) by mouth daily. 11/24/22  Yes Pashayan, Redgie Grayer, MD  calcium-vitamin D (OSCAL WITH D) 500-200 MG-UNIT tablet Take 1 tablet by mouth.   Yes [provider]  methocarbamol (ROBAXIN) 500 MG tablet Take 1 tablet (500 mg total) by mouth 2 (two) times daily as needed for muscle spasms. 01/12/23  Yes Saraiyah Hemminger, [place] N, FNP  naproxen (NAPROSYN) 500 MG tablet Take 1 tablet (500 mg total) by mouth 2 (two) times daily. 01/12/23  Yes Louretta Shorten, [place], FNP  polyethylene glycol powder [facility]) 17 GM/SCOOP powder Mix one capful in 8 ounces of liquid and drink daily when needed to manage constipation 01/04/22  Yes Lurlean Leyden, MD    Family History Family History  Problem Relation Age of Onset   Diabetes Mother    Allergic rhinitis Mother    Asthma Mother    Hypertension Maternal Grandmother    Heart disease Maternal Grandmother    Hyperlipidemia Maternal Grandmother    Hypertension Maternal Grandfather    Heart disease Maternal Grandfather    Hyperlipidemia Maternal Grandfather    Hypertension Paternal Grandmother    Heart disease Paternal Grandmother    Hyperlipidemia Paternal Grandmother    Hypertension Paternal  Grandfather    Heart disease Paternal Grandfather    Hyperlipidemia Paternal Grandfather     Social History Social History   Tobacco Use   Smoking status: Never   Smokeless tobacco: Never  Vaping Use   Vaping Use: Never used  Substance Use Topics   Alcohol use: No   Drug use: Yes    Types: Marijuana     Allergies   Patient has no known allergies.   Review of Systems Review of Systems  Constitutional:  Negative for fatigue and fever.  HENT:  Positive for dental problem and ear pain. Negative for ear discharge, facial swelling, mouth sores, postnasal drip, rhinorrhea, sinus pressure, sinus pain, sore throat and trouble swallowing.   Eyes:  Negative for discharge.  Respiratory:  Negative for cough and shortness of breath.   Cardiovascular:  Negative for chest pain.  Gastrointestinal:  Negative for abdominal pain.  Genitourinary:  Negative for dysuria.  Musculoskeletal:  Positive for arthralgias.  Neurological:  Negative for headaches.     Physical Exam Triage Vital Signs ED Triage Vitals  Enc Vitals Group     BP 01/12/23 0822 116/79     Pulse Rate 01/12/23 0822 71     Resp 01/12/23 0822 18     Temp 01/12/23 0822 98.6 F (37 C)     Temp Source 01/12/23 0822 Oral     SpO2 01/12/23 0822 96 %     Weight 01/12/23 0821 220 lb (99.8 kg)     Height 01/12/23 0821 5\' 5"  (1.651 m)     Head Circumference --      Peak Flow --      Pain Score 01/12/23 0820 10     Pain Loc --      Pain Edu? --      Excl. in [place]? --    No data found.  Updated Vital Signs BP 116/79 (BP Location: Right Arm)   Pulse 71   Temp 98.6 F (37 C) (Oral)   Resp 18   Ht 5\' 5"  (1.651 m)   Wt 220 lb (99.8 kg)   LMP 11/18/2022 (Approximate)   SpO2 96%   BMI 36.61 kg/m   Visual Acuity Right Eye Distance:   Left Eye Distance:   Bilateral Distance:    Right Eye Near:   Left Eye Near:    Bilateral Near:     Physical Exam Vitals and nursing note reviewed.  HENT:     Head: Normocephalic  and atraumatic.     Right Ear: Tympanic membrane, ear canal and external ear normal.     Left Ear: Tympanic membrane, ear canal and external ear  normal.     Nose: Nose normal.     Mouth/Throat:     Mouth: Mucous membranes are moist.     Dentition: Dental tenderness present. No dental abscesses.     Tongue: No lesions. Tongue does not deviate from midline.     Palate: No mass and lesions.     Pharynx: Uvula midline. No oropharyngeal exudate, posterior oropharyngeal erythema or uvula swelling.     Tonsils: No tonsillar exudate or tonsillar abscesses.      Comments: Tenderness along gum line of right lower jaw, no obvious dental decay, abscess, swelling or dental abnormalities. Wisdom teeth present and growing in in bilateral lower jaw. Eyes:     General: No scleral icterus.       Right eye: No discharge.        Left eye: No discharge.     Extraocular Movements: Extraocular movements intact.     Conjunctiva/sclera: Conjunctivae normal.     Pupils: Pupils are equal, round, and reactive to light.  Cardiovascular:     Rate and Rhythm: Normal rate and regular rhythm.     Heart sounds: Normal heart sounds. No murmur heard. Pulmonary:     Effort: Pulmonary effort is normal. No respiratory distress.     Breath sounds: Normal breath sounds.  Musculoskeletal:        General: No swelling, tenderness, deformity or signs of injury.     Cervical back: Normal range of motion. No rigidity or tenderness.  Lymphadenopathy:     Cervical: No cervical adenopathy.  Skin:    General: Skin is warm and dry.     Capillary Refill: Capillary refill takes less than 2 seconds.  Neurological:     Mental Status: She is alert and oriented to person, place, and time.  Psychiatric:        Mood and Affect: Mood normal.        Behavior: Behavior is cooperative.      UC Treatments / Results  Labs (all labs ordered are listed, but only abnormal results are displayed) Labs Reviewed - No data to  display  EKG   Radiology No results found.  Procedures Procedures (including critical care time)  Medications Ordered in UC Medications - No data to display  Initial Impression / Assessment and Plan / UC Course  I have reviewed the triage vital signs and the nursing notes.  Pertinent labs & imaging results that were available during my care of the patient were reviewed by me and considered in my medical decision making (see chart for details).  Vitals in triage reviewed, patient is hemodynamically stable.  Unclear etiology into jaw and shoulder pain.  Does not appear to be TMJ, has no pain with opening and closing of jaw and no clicking.  Does have tenderness along her right lower gumline, no obvious abscess, swelling, drainage or dental caries.  Suspect dental abnormalities and encouraged to follow-up with a dentist.  Unclear etiology to right shoulder pain and restricted range of motion to right shoulder, could be related to dental pain with associated pain and radiation, no recent injury or heavy lifting.  Do not feel as if patient is suffering from medical emergency at this time.  Will trial low-dose muscle relaxer as needed and anti-inflammatories.  Encouraged to follow-up with a dentist.  Return and follow-up precautions discussed, no questions at this time.    Final Clinical Impressions(s) / UC Diagnoses   Final diagnoses:  Jaw pain  Acute pain of right shoulder  Discharge Instructions      Overall I believe that your jaw pain is stemming from a dental issue, I have attached some dental resources below and encourage you to follow-up with a dentist.  Please take the naproxen twice daily, you can take it with food to help prevent stomach upset.  You can take the muscle relaxer as needed for your muscle spasm and shoulder pain, please be careful taking this as it may cause drowsiness, do not drink or drive on this medication.  Please return to clinic or follow-up with your  primary care provider if your pain persist, you develop fever, swelling, or changes in condition.  [facility] in [place], [place] Address: [address], [place], [place] [address] Phone: [phone]  [place] [phone] extension [phone] [address].  Dr. Donn Pierini [phone] [address] [facility] [place].  Rescue mission [phone] extension [id number] N[address] [place], [place], [address] First come first serve for the first 10 clients.  May do simple extractions only, no wisdom teeth or surgery.  You may try the second for Thursday of the month starting at [employer] [facility] You may call the school to see if they are still helping to provide dental care for emergent cases.       ED Prescriptions     Medication Sig Dispense Auth. Provider   naproxen (NAPROSYN) 500 MG tablet Take 1 tablet (500 mg total) by mouth 2 (two) times daily. 30 tablet Louretta Shorten, [place] N, [place]   methocarbamol (ROBAXIN) 500 MG tablet Take 1 tablet (500 mg total) by mouth 2 (two) times daily as needed for muscle spasms. 20 tablet Talya Quain, [place] N, [place]      PDMP not reviewed this encounter.   Louretta Shorten [place] N, [place] 01/12/23 [phone]

## 2023-01-12 NOTE — Discharge Instructions (Addendum)
Overall I believe that your jaw pain is stemming from a dental issue, I have attached some dental resources below and encourage you to follow-up with a dentist.  Please take the naproxen twice daily, you can take it with food to help prevent stomach upset.  You can take the muscle relaxer as needed for your muscle spasm and shoulder pain, please be careful taking this as it may cause drowsiness, do not drink or drive on this medication.  Please return to clinic or follow-up with your primary care provider if your pain persist, you develop fever, swelling, or changes in condition.  [facility] in [employer] [place] Address: [address], [place], [place] [address] Phone: [phone]  [place] [phone] extension [phone] [address].  Dr. Donn Pierini [phone] [place] [phone] [address] [facility] [place].  Rescue mission [phone] extension [phone] N[address] [place], [place], [address] First come first serve for the first 10 clients.  May do simple extractions only, no wisdom teeth or surgery.  You may try the second for Thursday of the month starting at [place] [facility] You may call the school to see if they are still helping to provide dental care for emergent cases.

## 2023-01-29 ENCOUNTER — Encounter: Payer: Self-pay | Admitting: Pediatrics

## 2023-01-29 DIAGNOSIS — E282 Polycystic ovarian syndrome: Secondary | ICD-10-CM

## 2023-02-03 NOTE — Addendum Note (Signed)
Addended by: Maree Erie on: 02/03/2023 12:30 PM   Modules accepted: Orders

## 2023-02-23 ENCOUNTER — Encounter (HOSPITAL_COMMUNITY): Payer: Self-pay | Admitting: Student in an Organized Health Care Education/Training Program

## 2023-02-23 ENCOUNTER — Ambulatory Visit (HOSPITAL_BASED_OUTPATIENT_CLINIC_OR_DEPARTMENT_OTHER): Payer: Medicaid Other | Admitting: Student in an Organized Health Care Education/Training Program

## 2023-02-23 VITALS — BP 135/86 | HR 85 | Ht 65.0 in | Wt 223.4 lb

## 2023-02-23 DIAGNOSIS — F209 Schizophrenia, unspecified: Secondary | ICD-10-CM | POA: Diagnosis not present

## 2023-02-23 DIAGNOSIS — F339 Major depressive disorder, recurrent, unspecified: Secondary | ICD-10-CM

## 2023-02-23 DIAGNOSIS — F4310 Post-traumatic stress disorder, unspecified: Secondary | ICD-10-CM | POA: Diagnosis not present

## 2023-02-23 DIAGNOSIS — F411 Generalized anxiety disorder: Secondary | ICD-10-CM | POA: Diagnosis not present

## 2023-02-23 MED ORDER — SERTRALINE HCL 25 MG PO TABS: 25.0000 mg | ORAL_TABLET | Freq: Every day | ORAL | 1 refills | Status: DC

## 2023-02-23 MED ORDER — ARIPIPRAZOLE 15 MG PO TABS: 15.0000 mg | ORAL_TABLET | Freq: Every day | ORAL | 1 refills | Status: DC
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
## 2023-03-09 ENCOUNTER — Encounter (HOSPITAL_COMMUNITY): Payer: Self-pay | Admitting: Student in an Organized Health Care Education/Training Program

## 2023-03-09 ENCOUNTER — Ambulatory Visit (HOSPITAL_BASED_OUTPATIENT_CLINIC_OR_DEPARTMENT_OTHER): Payer: Medicaid Other | Admitting: Student in an Organized Health Care Education/Training Program

## 2023-03-09 VITALS — BP 123/79 | HR 62 | Ht 65.0 in | Wt 219.6 lb

## 2023-03-09 DIAGNOSIS — F339 Major depressive disorder, recurrent, unspecified: Secondary | ICD-10-CM

## 2023-03-09 DIAGNOSIS — F411 Generalized anxiety disorder: Secondary | ICD-10-CM | POA: Diagnosis not present

## 2023-03-09 DIAGNOSIS — F4310 Post-traumatic stress disorder, unspecified: Secondary | ICD-10-CM | POA: Diagnosis not present

## 2023-03-09 DIAGNOSIS — F209 Schizophrenia, unspecified: Secondary | ICD-10-CM | POA: Diagnosis not present

## 2023-03-09 MED ORDER — ARIPIPRAZOLE 15 MG PO TABS: 15.0000 mg | ORAL_TABLET | Freq: Every day | ORAL | 1 refills | Status: DC

## 2023-03-09 MED ORDER — SERTRALINE HCL 25 MG PO TABS: 25.0000 mg | ORAL_TABLET | Freq: Every day | ORAL | 1 refills | Status: DC
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
## 2023-03-15 ENCOUNTER — Encounter: Payer: Self-pay | Admitting: Pediatrics

## 2023-03-16 ENCOUNTER — Telehealth (HOSPITAL_COMMUNITY): Payer: Self-pay | Admitting: *Deleted

## 2023-03-16 NOTE — Telephone Encounter (Signed)
Pt's mother LVM again requesting a letter for an ESA; her dog. Pt has asked PCP however that was referred back to [place]. Please review. Thanks.

## 2023-04-06 ENCOUNTER — Ambulatory Visit (HOSPITAL_COMMUNITY)
Admission: EM | Admit: 2023-04-06 | Discharge: 2023-04-06 | Disposition: A | Discharge status: Home / Self Care | Payer: Medicaid Other | Attending: Emergency Medicine | Admitting: Emergency Medicine

## 2023-04-06 ENCOUNTER — Encounter (HOSPITAL_COMMUNITY): Payer: Self-pay | Admitting: Emergency Medicine

## 2023-04-06 DIAGNOSIS — K0889 Other specified disorders of teeth and supporting structures: Secondary | ICD-10-CM | POA: Diagnosis not present

## 2023-04-06 DIAGNOSIS — K047 Periapical abscess without sinus: Secondary | ICD-10-CM

## 2023-04-06 HISTORY — DX: Polycystic ovarian syndrome: E28.2

## 2023-04-06 MED ORDER — KETOROLAC TROMETHAMINE 30 MG/ML IJ SOLN
INTRAMUSCULAR | Status: AC
  Filled 2023-04-06: qty 1

## 2023-04-06 MED ORDER — IBUPROFEN 800 MG PO TABS: 800.0000 mg | ORAL_TABLET | Freq: Three times a day (TID) | ORAL | 0 refills | Status: AC

## 2023-04-06 MED ORDER — AMOXICILLIN-POT CLAVULANATE 875-125 MG PO TABS: 1.0000 | ORAL_TABLET | Freq: Two times a day (BID) | ORAL | 0 refills | Status: DC

## 2023-04-06 MED ORDER — KETOROLAC TROMETHAMINE 30 MG/ML IJ SOLN
30.0000 mg | Freq: Once | INTRAMUSCULAR | Status: AC
  Administered 2023-04-06: 30 mg via INTRAMUSCULAR

## 2023-04-06 NOTE — ED Triage Notes (Signed)
Pt c/o right lower dental pain and swelling for a few days.

## 2023-04-06 NOTE — ED Provider Notes (Signed)
[facility]    CSN: [id number] Arrival date & time: 04/06/23  0915      History   Chief Complaint Chief Complaint  Patient presents with   Dental Problem    HPI Quaneisha Apollo is a 22 y.o. female.   Patient presents to clinic for right lower dental pain and swelling for the past few days.  She thinks she had a cavity filled on this tooth a few years back, but is unsure.  She was "just chilling and her tooth broke off.'' She has taken Tylenol without much relief, was also using a tea bag to help with swelling. Does not currently have a dentist. Denies fevers or trouble swallowing.   The history is provided by the patient and medical records.    Past Medical History:  Diagnosis Date   ADHD (attention deficit hyperactivity disorder)    Anxiety    mild anxiety   Decreased visual acuity 12/03/2015   Depression    PCOS (polycystic ovarian syndrome)    Schizophrenia [facility])     Patient Active Problem List   Diagnosis Date Noted   Positive screening for depression on 9-item Patient Health Questionnaire (PHQ-9) 07/27/2022   Primary oligomenorrhea 07/27/2022   PCOS (polycystic ovarian syndrome) 07/27/2022   Subjective vision disturbance, left eye    Complicated migraine 12/03/2015   Decreased visual acuity 12/03/2015   New daily persistent headache 12/01/2015   Conversion disorder    Hallucinations    Observed seizure-like activity (HCC)    Cerebral calcification 11/11/2015   Mood disorder (HCC)    Depression 06/20/2014   ADHD (attention deficit hyperactivity disorder), 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   Acanthosis nigricans 06/20/2014   Body mass index, pediatric, greater than or equal to 95th percentile for age 23/12/2013    Past Surgical History:  Procedure Laterality Date   ADENOIDECTOMY     RADIOLOGY WITH ANESTHESIA N/A 11/12/2015   Procedure: RADIOLOGY WITH ANESTHESIA;  Surgeon: Medication Radiologist, MD;  Location: [facility];  Service: Radiology;  Laterality: N/A;    TONSILLECTOMY     TONSILLECTOMY AND ADENOIDECTOMY N/A 02/22/2017   Procedure: TONSILLECTOMY AND ADENOIDECTOMY;  Surgeon: Newman Pies, MD;  Location: [facility];  Service: ENT;  Laterality: N/A;    OB History     Gravida  0   Para      Term      Preterm      AB      Living         SAB      IAB      Ectopic      Multiple      Live Births               Home Medications    Prior to Admission medications   Medication Sig Start Date End Date Taking? Authorizing Provider  amoxicillin-clavulanate (AUGMENTIN) 875-125 MG tablet Take 1 tablet by mouth every 12 (twelve) hours. 04/06/23  Yes Rinaldo Ratel, [place] N, FNP  ibuprofen (ADVIL) 800 MG tablet Take 1 tablet (800 mg total) by mouth 3 (three) times daily. 04/06/23  Yes Rinaldo Ratel, [place] N, FNP  ARIPiprazole (ABILIFY) 15 MG tablet Take 1 tablet (15 mg total) by mouth daily. 03/09/23   Lauro Franklin, MD  sertraline (ZOLOFT) 25 MG tablet Take 1 tablet (25 mg total) by mouth daily. 03/09/23   Lauro Franklin, MD    Family History Family History  Problem Relation Age of Onset   Diabetes Mother  Allergic rhinitis Mother    Asthma Mother    Hypertension Maternal Grandmother    Heart disease Maternal Grandmother    Hyperlipidemia Maternal Grandmother    Hypertension Maternal Grandfather    Heart disease Maternal Grandfather    Hyperlipidemia Maternal Grandfather    Hypertension Paternal Grandmother    Heart disease Paternal Grandmother    Hyperlipidemia Paternal Grandmother    Hypertension Paternal Grandfather    Heart disease Paternal Grandfather    Hyperlipidemia Paternal Grandfather     Social History Social History   Tobacco Use   Smoking status: Never   Smokeless tobacco: Never  Vaping Use   Vaping Use: Never used  Substance Use Topics   Alcohol use: No   Drug use: Yes    Types: Marijuana     Allergies   Patient has no known allergies.   Review of Systems Review of  Systems  Constitutional:  Negative for fever.  HENT:  Positive for dental problem. Negative for trouble swallowing.      Physical Exam Triage Vital Signs ED Triage Vitals  Enc Vitals Group     BP 04/06/23 0942 125/74     Pulse Rate 04/06/23 0942 71     Resp 04/06/23 0942 14     Temp 04/06/23 0942 98.2 F (36.8 C)     Temp Source 04/06/23 0942 Oral     SpO2 04/06/23 0942 98 %     Weight --      Height --      Head Circumference --      Peak Flow --      Pain Score 04/06/23 0940 10     Pain Loc --      Pain Edu? --      Excl. in GC? --    No data found.  Updated Vital Signs BP 125/74 (BP Location: Right Arm)   Pulse 71   Temp 98.2 F (36.8 C) (Oral)   Resp 14   SpO2 98%   Visual Acuity Right Eye Distance:   Left Eye Distance:   Bilateral Distance:    Right Eye Near:   Left Eye Near:    Bilateral Near:     Physical Exam Vitals and nursing note reviewed.  Constitutional:      Appearance: Normal appearance.  HENT:     Head: Normocephalic and atraumatic.     Right Ear: External ear normal.     Left Ear: External ear normal.     Nose: Nose normal.     Mouth/Throat:     Lips: Pink.     Mouth: Mucous membranes are moist.     Dentition: Abnormal dentition. Dental tenderness, dental caries and dental abscesses present.      Comments: Dental caries and broken tooth to right lower molar, purulent area at base of tooth, tender to palpation.  Eyes:     Conjunctiva/sclera: Conjunctivae normal.  Cardiovascular:     Rate and Rhythm: Normal rate.  Pulmonary:     Effort: Pulmonary effort is normal. No respiratory distress.  Musculoskeletal:        General: No swelling. Normal range of motion.  Skin:    General: Skin is warm and dry.  Neurological:     General: No focal deficit present.     Mental Status: She is alert.  Psychiatric:        Mood and Affect: Mood normal.        Behavior: Behavior is cooperative.  UC Treatments / Results  Labs (all labs  ordered are listed, but only abnormal results are displayed) Labs Reviewed - No data to display  EKG   Radiology No results found.  Procedures Procedures (including critical care time)  Medications Ordered in UC Medications  ketorolac (TORADOL) 30 MG/ML injection 30 mg (has no administration in time range)    Initial Impression / Assessment and Plan / UC Course  I have reviewed the triage vital signs and the nursing notes.  Pertinent labs & imaging results that were available during my care of the patient were reviewed by me and considered in my medical decision making (see chart for details).  Vitals and triage reviewed, patient is hemodynamically stable.  Broken open right tooth with associated drainage at its base, right jaw swelling.  Uvula is midline, able to speak freely, swallowing normally.  Will cover with Augmentin for dental infection and early abscess.  Given IM Toradol in clinic, start ibuprofen tomorrow.  Given dental resources.  Return and follow-up precautions given, no questions at this time.     Final Clinical Impressions(s) / UC Diagnoses   Final diagnoses:  Pain, dental  Abscess, dental     Discharge Instructions      You have an infection in your right lower tooth.  I am treating you with antibiotics, please take all antibiotics as prescribed until finished, you take them with food to help prevent gastrointestinal upset.  Starting tomorrow you can take 800 mg of ibuprofen every 8 hours for pain and inflammation.  We have given you a shot of Toradol that should last throughout the day, you can take Tylenol 500 mg as needed for breakthrough pain.  Avoid overly hot or cold foods, and do not chew on that side.  You appear to have a broken tooth, this needs to be evaluated by a dentist/oral surgeon.  Below are some resources, as you will need to reach out to them to fix the root of the issue to help prevent reinfection.  Return to clinic if you have trouble  swallowing, no improvement in your swelling over the next 72 hours, or any new concerning symptoms.  [facility] [place] Address: [address] [place], [place], [place] [address] Phone: [phone]  [facility] [phone] extension [phone] [address]  Dr. Lawrence Marseilles [phone] [address]  [place] [phone] [address] [facility] [place].  [facility] [phone] extension [phone], [place], [place], [address] First come first serve for the first 10 clients.  May do simple extractions only, no wisdom teeth or surgery.  You may try the second for Thursday of the month starting at 6:30 AM.  [facility] You may call the school to see if they are still helping to provide dental care for emergent cases.      ED Prescriptions     Medication Sig Dispense Auth. Provider   amoxicillin-clavulanate (AUGMENTIN) 875-125 MG tablet Take 1 tablet by mouth every 12 (twelve) hours. 14 tablet Rinaldo Ratel, [place] N, [place]   ibuprofen (ADVIL) 800 MG tablet Take 1 tablet (800 mg total) by mouth 3 (three) times daily. 21 tablet Jeanine Caven, [place] N, [place]      PDMP not reviewed this encounter.   Rinaldo Ratel [place] N, [place] 04/06/23 [phone]

## 2023-04-06 NOTE — Discharge Instructions (Addendum)
You have an infection in your right lower tooth.  I am treating you with antibiotics, please take all antibiotics as prescribed until finished, you take them with food to help prevent gastrointestinal upset.  Starting tomorrow you can take 800 mg of ibuprofen every 8 hours for pain and inflammation.  We have given you a shot of Toradol that should last throughout the day, you can take Tylenol 500 mg as needed for breakthrough pain.  Avoid overly hot or cold foods, and do not chew on that side.  You appear to have a broken tooth, this needs to be evaluated by a dentist/oral surgeon.  Below are some resources, as you will need to reach out to them to fix the root of the issue to help prevent reinfection.  Return to clinic if you have trouble swallowing, no improvement in your swelling over the next 72 hours, or any new concerning symptoms.  [facility] in [place], [place] Address: [address] [place], [place], [place] [address] Phone: [phone]  [facility] [phone] extension [phone] [address]  Dr. Lawrence Marseilles [phone] [address]  [place] [phone] [address] [facility] [place].  [facility] [phone] extension [phone], [place], [place], [address] First come first serve for the first 10 clients.  May do simple extractions only, no wisdom teeth or surgery.  You may try the second for Thursday of the month starting at 6:30 AM.  [facility] You may call the school to see if they are still helping to provide dental care for emergent cases.

## 2023-04-13 HISTORY — PX: WISDOM TOOTH EXTRACTION: SHX21

## 2023-04-18 ENCOUNTER — Ambulatory Visit (INDEPENDENT_AMBULATORY_CARE_PROVIDER_SITE_OTHER): Payer: MEDICAID | Admitting: Family Medicine

## 2023-04-18 ENCOUNTER — Encounter (INDEPENDENT_AMBULATORY_CARE_PROVIDER_SITE_OTHER): Payer: Self-pay | Admitting: Family Medicine

## 2023-04-18 VITALS — BP 106/64 | HR 77 | Temp 98.3°F | Ht 65.5 in | Wt 211.0 lb

## 2023-04-18 DIAGNOSIS — R4589 Other symptoms and signs involving emotional state: Secondary | ICD-10-CM

## 2023-04-18 DIAGNOSIS — E782 Mixed hyperlipidemia: Secondary | ICD-10-CM

## 2023-04-18 DIAGNOSIS — Z6834 Body mass index (BMI) 34.0-34.9, adult: Secondary | ICD-10-CM

## 2023-04-18 DIAGNOSIS — E669 Obesity, unspecified: Secondary | ICD-10-CM

## 2023-04-18 NOTE — Progress Notes (Unsigned)
Office: [phone]  /  Fax: [phone]   Initial Visit  Sabrina Poole was seen in clinic today to evaluate for obesity. She is interested in losing weight to improve overall health and reduce the risk of weight related complications. She presents today to review program treatment options, initial physical assessment, and evaluation.     She was referred by: PCP  When asked how has your weight affected you? She states: Having fatigue and Problems with eating patterns  Some associated conditions: PCOS  Weight promoting medications identified: Psychotropic medications  Response to medication: Never tried medications   Past medical history includes:   Past Medical History:  Diagnosis Date   ADHD (attention deficit hyperactivity disorder)    Anxiety    mild anxiety   Decreased visual acuity 12/03/2015   Depression    PCOS (polycystic ovarian syndrome)    Schizophrenia (HCC)      Objective:   BP 106/64   Pulse 77   Temp 98.3 F (36.8 C)   Ht 5' 5.5" (1.664 m)   Wt 211 lb (95.7 kg)   SpO2 96%   BMI 34.58 kg/m  She was weighed on the bioimpedance scale: Body mass index is 34.58 kg/m.  Peak Weight: 230 lbs ,Visceral Fat Rating:7, Body Fat%:39.9  General:  Alert, oriented and cooperative. Patient is in no acute distress.  Respiratory: Normal respiratory effort, no problems with respiration noted  Extremities: Normal range of motion.    Mental Status: Normal mood and affect. Normal behavior. Normal judgment and thought content.   Assessment and Plan:  1. Mixed hyperlipidemia Patient has elevated triglycerides and LDL, and HDL is low.  She is not on statin.  She would like to improve with diet, exercise, and weight loss.  Plan: We will schedule the patient for fasting labs and testing for weight loss.  2. Thoughts of self harm Patient has a history of cutting, especially when feeling guilty.  She has not done this in the last year.  Plan: We will monitor the  patient very closely and refer out if needed.  3. BMI 34.0-34.9,adult  4. Obesity, Beginning BMI 34.7 We reviewed weight, biometrics, associated medical conditions and contributing factors with patient. She would benefit from weight loss therapy via a modified calorie, low-carb, high-protein nutritional plan tailored to their REE (resting energy expenditure) which will be determined by indirect calorimetry.  We will also assess for cardiometabolic risk and nutritional derangements via fasting serologies at her next appointment.      Obesity Treatment / Action Plan:  Patient will work on garnering support from family and friends to begin weight loss journey. Will complete provided nutritional and psychosocial assessment questionnaire before the next appointment. Will be scheduled for indirect calorimetry to determine resting energy expenditure in a fasting state.  This will allow [place] to create a reduced calorie, high-protein meal plan to promote loss of fat mass while preserving muscle mass. Was counseled on nutritional approaches to weight loss and benefits of reducing processed foods and consuming plant-based foods and high quality protein as part of nutritional weight management.  Obesity Education Performed Today:  She was weighed on the bioimpedance scale and results were discussed and documented in the synopsis.  We discussed obesity as a disease and the importance of a more detailed evaluation of all the factors contributing to the disease.  We discussed the importance of long term lifestyle changes which include nutrition, exercise and behavioral modifications as well as the importance of customizing this  to her specific health and social needs.  We discussed the benefits of reaching a healthier weight to alleviate the symptoms of existing conditions and reduce the risks of the biomechanical, metabolic and psychological effects of obesity.  Henretter Chakrabarti appears to be in the action  stage of change and states they are ready to start intensive lifestyle modifications and behavioral modifications.   Reviewed by clinician on day of visit: allergies, medications, problem list, medical history, surgical history, family history, social history, and previous encounter notes.  I have personally spent 45 minutes total time today in preparation, patient care, and documentation for this visit, including the following: review of clinical lab tests; review of medical tests/procedures/services.   I, Burt Knack, am acting as transcriptionist for Quillian Quince, MD   I have reviewed the above documentation for accuracy and completeness, and I agree with the above. Quillian Quince, MD

## 2023-04-21 ENCOUNTER — Ambulatory Visit (HOSPITAL_COMMUNITY)
Admission: EM | Admit: 2023-04-21 | Discharge: 2023-04-21 | Disposition: A | Discharge status: Home / Self Care | Payer: MEDICAID | Attending: Emergency Medicine | Admitting: Emergency Medicine

## 2023-04-21 ENCOUNTER — Encounter (HOSPITAL_COMMUNITY): Payer: Self-pay

## 2023-04-21 DIAGNOSIS — H6122 Impacted cerumen, left ear: Secondary | ICD-10-CM

## 2023-04-21 DIAGNOSIS — H60392 Other infective otitis externa, left ear: Secondary | ICD-10-CM | POA: Diagnosis not present

## 2023-04-21 DIAGNOSIS — J302 Other seasonal allergic rhinitis: Secondary | ICD-10-CM

## 2023-04-21 MED ORDER — CETIRIZINE HCL 10 MG PO TABS: 10.0000 mg | ORAL_TABLET | Freq: Every day | ORAL | 1 refills | Status: AC

## 2023-04-21 MED ORDER — FLUTICASONE PROPIONATE 50 MCG/ACT NA SUSP: 1.0000 | Freq: Every day | NASAL | 1 refills | Status: AC

## 2023-04-21 MED ORDER — CIPROFLOXACIN-DEXAMETHASONE 0.3-0.1 % OT SUSP: 4.0000 [drp] | Freq: Two times a day (BID) | OTIC | 0 refills | Status: AC

## 2023-04-21 NOTE — ED Triage Notes (Signed)
Patient having pain in the left ear for 2 months. Has some hearing loss as well. No h/o ear problems. Patient states hard wax has been coming out of the ear.   Has been using ibuprofen and tylenol with no relief. Patient has tried otc ear drops with no relief.

## 2023-04-21 NOTE — ED Provider Notes (Signed)
[facility]    CSN: [id number] Arrival date & time: 04/21/23  0946    HISTORY   Chief Complaint  Patient presents with   Otalgia   HPI Sabrina Poole is a pleasant, 22 y.o. female who presents to urgent care today. Patient having pain in the left ear for 2 months, states she feels like she can't hear well in her left ear right now.  Denies hx hearing problems otherwise or hx frequent ear infections. Patient states hard wax came out of the ear when her mother used a pair of tweezers to clean it out.  States she has been taking ibuprofen and tylenol and using OTC ear pain drops with no relief. Of note, patient completed a 7-day course of Augmentin for dental infection on April 13, 2023, states tooth is feeling better.    The history is provided by the patient.   Past Medical History:  Diagnosis Date   ADHD (attention deficit hyperactivity disorder)    Anxiety    mild anxiety   Decreased visual acuity 12/03/2015   Depression    PCOS (polycystic ovarian syndrome)    Schizophrenia [facility])    Patient Active Problem List   Diagnosis Date Noted   Positive screening for depression on 9-item Patient Health Questionnaire (PHQ-9) 07/27/2022   Primary oligomenorrhea 07/27/2022   PCOS (polycystic ovarian syndrome) 07/27/2022   Subjective vision disturbance, left eye    Complicated migraine 12/03/2015   Decreased visual acuity 12/03/2015   New daily persistent headache 12/01/2015   Conversion disorder    Hallucinations    Observed seizure-like activity (HCC)    Cerebral calcification 11/11/2015   Mood disorder (HCC)    Depression 06/20/2014   ADHD (attention deficit hyperactivity disorder), 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   Acanthosis nigricans 06/20/2014   Body mass index, pediatric, greater than or equal to 95th percentile for age 77/12/2013   Past Surgical History:  Procedure Laterality Date   ADENOIDECTOMY     RADIOLOGY WITH ANESTHESIA N/A 11/12/2015   Procedure: RADIOLOGY  WITH ANESTHESIA;  Surgeon: Medication Radiologist, MD;  Location: [facility];  Service: Radiology;  Laterality: N/A;   TONSILLECTOMY     TONSILLECTOMY AND ADENOIDECTOMY N/A 02/22/2017   Procedure: TONSILLECTOMY AND ADENOIDECTOMY;  Surgeon: Newman Pies, MD;  Location: [facility];  Service: ENT;  Laterality: N/A;   WISDOM TOOTH EXTRACTION Bilateral 04/13/2023   OB History     Gravida  0   Para      Term      Preterm      AB      Living         SAB      IAB      Ectopic      Multiple      Live Births             Home Medications    Prior to Admission medications   Medication Sig Start Date End Date Taking? Authorizing Provider  ARIPiprazole (ABILIFY) 15 MG tablet Take 1 tablet (15 mg total) by mouth daily. 03/09/23  Yes Pashayan, Mardelle Matte, MD  ibuprofen (ADVIL) 800 MG tablet Take 1 tablet (800 mg total) by mouth 3 (three) times daily. 04/06/23  Yes Rinaldo Ratel, [place] N, FNP  sertraline (ZOLOFT) 25 MG tablet Take 1 tablet (25 mg total) by mouth daily. 03/09/23  Yes Lauro Franklin, MD  amoxicillin-clavulanate (AUGMENTIN) 875-125 MG tablet Take 1 tablet by mouth every 12 (twelve) hours. 04/06/23   Garrison, [place]  N, FNP    Family History Family History  Problem Relation Age of Onset   Diabetes Mother    Allergic rhinitis Mother    Asthma Mother    Hypertension Maternal Grandmother    Heart disease Maternal Grandmother    Hyperlipidemia Maternal Grandmother    Hypertension Maternal Grandfather    Heart disease Maternal Grandfather    Hyperlipidemia Maternal Grandfather    Hypertension Paternal Grandmother    Heart disease Paternal Grandmother    Hyperlipidemia Paternal Grandmother    Hypertension Paternal Grandfather    Heart disease Paternal Grandfather    Hyperlipidemia Paternal Grandfather    Social History Social History   Tobacco Use   Smoking status: Never   Smokeless tobacco: Never  Vaping Use   Vaping Use: Some days   Substances:  Nicotine, Flavoring  Substance Use Topics   Alcohol use: No   Drug use: Yes    Types: Marijuana   Allergies   Patient has no known allergies.  Review of Systems Review of Systems Pertinent findings revealed after performing a 14 point review of systems has been noted in the history of present illness.  Physical Exam Vital Signs BP 121/75 (BP Location: Left Arm)   Pulse 63   Temp 98.4 F (36.9 C) (Oral)   Resp 18   Ht 5\' 5"  (1.651 m)   Wt 211 lb (95.7 kg)   LMP  (LMP Unknown)   SpO2 96%   BMI 35.11 kg/m   No data found.  Physical Exam Vitals and nursing note reviewed.  Constitutional:      General: She is not in acute distress.    Appearance: Normal appearance. She is not ill-appearing.  HENT:     Head: Normocephalic and atraumatic.     Salivary Glands: Right salivary gland is not diffusely enlarged or tender. Left salivary gland is not diffusely enlarged or tender.     Right Ear: Hearing, tympanic membrane, ear canal and external ear normal. No drainage. No middle ear effusion. There is no impacted cerumen. Tympanic membrane is not erythematous or bulging.     Left Ear: External ear normal. Decreased hearing noted. There is impacted cerumen.     Nose: Mucosal edema and rhinorrhea present. No nasal deformity, septal deviation or congestion. Rhinorrhea is clear.     Right Turbinates: Not enlarged, swollen or pale.     Left Turbinates: Not enlarged, swollen or pale.     Right Sinus: No maxillary sinus tenderness or frontal sinus tenderness.     Left Sinus: No maxillary sinus tenderness or frontal sinus tenderness.     Mouth/Throat:     Lips: Pink. No lesions.     Mouth: Mucous membranes are moist. No oral lesions.     Pharynx: Oropharynx is clear. Uvula midline. No pharyngeal swelling, oropharyngeal exudate, posterior oropharyngeal erythema or uvula swelling.     Tonsils: No tonsillar exudate. 0 on the right. 0 on the left.  Eyes:     General: Lids are normal.         Right eye: No discharge.        Left eye: No discharge.     Extraocular Movements: Extraocular movements intact.     Conjunctiva/sclera: Conjunctivae normal.     Right eye: Right conjunctiva is not injected.     Left eye: Left conjunctiva is not injected.     Pupils: Pupils are equal, round, and reactive to light.  Neck:     Trachea: Trachea and phonation  normal.  Cardiovascular:     Rate and Rhythm: Normal rate and regular rhythm.     Pulses: Normal pulses.     Heart sounds: Normal heart sounds. No murmur heard.    No friction rub. No gallop.  Pulmonary:     Effort: Pulmonary effort is normal. No accessory muscle usage, prolonged expiration or respiratory distress.     Breath sounds: Normal breath sounds. No stridor, decreased air movement or transmitted upper airway sounds. No decreased breath sounds, wheezing, rhonchi or rales.  Chest:     Chest wall: No tenderness.  Musculoskeletal:        General: Normal range of motion.     Cervical back: Full passive range of motion without pain, normal range of motion and neck supple. Normal range of motion.  Lymphadenopathy:     Cervical: No cervical adenopathy.  Skin:    General: Skin is warm and dry.     Findings: No erythema or rash.  Neurological:     General: No focal deficit present.     Mental Status: She is alert and oriented to person, place, and time.  Psychiatric:        Mood and Affect: Mood normal.        Behavior: Behavior normal.     Visual Acuity Right Eye Distance:   Left Eye Distance:   Bilateral Distance:    Right Eye Near:   Left Eye Near:    Bilateral Near:     [facility] Couse / Diagnostics / Procedures:     Radiology No results found.  Procedures Ear Cerumen Removal  Date/Time: 04/21/2023 10:08 AM  Performed by: Theadora Rama Scales, PA-C Authorized by: Theadora Rama Scales, PA-C   Consent:    Consent obtained:  Verbal   Consent given by:  Patient   Risks, benefits, and alternatives were  discussed: yes     Risks discussed:  Bleeding, infection, pain, TM perforation, incomplete removal and dizziness   Alternatives discussed:  No treatment, delayed treatment, alternative treatment, observation and referral Universal protocol:    Procedure explained and questions answered to patient or proxy's satisfaction: yes     Patient identity confirmed:  Verbally with patient and arm band Procedure details:    Location:  L ear   Procedure type: irrigation     Procedure outcomes: cerumen removed   Post-procedure details:    Inspection:  No bleeding, ear canal clear and macerated skin   Hearing quality:  Normal   Procedure completion:  Tolerated Comments:     Per patient, a very large chunk of wax came out of her ear.+  (including critical care time) EKG  Pending results:  Labs Reviewed - No data to display  Medications Ordered in UC: Medications - No data to display  UC Diagnoses / Final Clinical Impressions(s)   I have reviewed the triage vital signs and the nursing notes.  Pertinent labs & imaging results that were available during my care of the patient were reviewed by me and considered in my medical decision making (see chart for details).    Final diagnoses:  Impacted cerumen of left ear  Other infective acute otitis externa of left ear   Cerumen removal was performed without complication, patient reported significant improvement of hearing after this was complete.  Patient strongly cautioned not to attempt to remove anything from her ear using tweezers, here pins or Q-tips, particularly not to allow anyone else to do so as well due to risk of eardrum  rupture and permanent hearing loss.  Patient was provided with a prescription for Ciprodex for relief of pain and maceration in her left external ear secondary to prolonged cerumen impaction.  Patient also advised to begin Zyrtec and Flonase for upper respiratory allergies, this may decrease the amount of cerumen she produces.   Conservative care recommended.  Return precautions advised.  Please see discharge instructions below for further details of plan of care as provided to patient. ED Prescriptions     Medication Sig Dispense Auth. Provider   ciprofloxacin-dexamethasone (CIPRODEX) OTIC suspension Place 4 drops into the left ear 2 (two) times daily. 7.5 mL Theadora Rama Scales, PA-C   fluticasone (FLONASE) 50 MCG/ACT nasal spray Place 1 spray into both nostrils daily. 47.4 mL Theadora Rama Scales, PA-C   cetirizine (ZYRTEC ALLERGY) 10 MG tablet Take 1 tablet (10 mg total) by mouth at bedtime. 90 tablet Theadora Rama Scales, [place]      PDMP not reviewed this encounter.  Disposition Upon Discharge:  Condition: stable for discharge home Home: take medications as prescribed; routine discharge instructions as discussed; follow up as advised.  Patient presented with an acute illness with associated systemic symptoms and significant discomfort requiring urgent management. In my opinion, this is a condition that a prudent lay person (someone who possesses an average knowledge of health and medicine) may potentially expect to result in complications if not addressed urgently such as respiratory distress, impairment of bodily function or dysfunction of bodily organs.   Routine symptom specific, illness specific and/or disease specific instructions were discussed with the patient and/or caregiver at length.   As such, the patient has been evaluated and assessed, work-up was performed and treatment was provided in alignment with urgent care protocols and evidence based medicine.  Patient/parent/caregiver has been advised that the patient may require follow up for further testing and treatment if the symptoms continue in spite of treatment, as clinically indicated and appropriate.  Patient/parent/caregiver has been advised to return to the [facility] or PCP in 3-5 days if no better; to PCP or the Emergency Department if new  signs and symptoms develop, or if the current signs or symptoms continue to change or worsen for further workup, evaluation and treatment as clinically indicated and appropriate  The patient will follow up with their current PCP if and as advised. If the patient does not currently have a PCP we will assist them in obtaining one.   The patient may need specialty follow up if the symptoms continue, in spite of conservative treatment and management, for further workup, evaluation, consultation and treatment as clinically indicated and appropriate.  Patient/parent/caregiver verbalized understanding and agreement of plan as discussed.  All questions were addressed during visit.  Please see discharge instructions below for further details of plan.  Discharge Instructions:   Discharge Instructions      Please read below to learn more about the medications, dosages and frequencies that I recommend to help alleviate your symptoms and to get you feeling better soon:   Ciprodex (ciprofloxacin, dexamethasone): Please instill 4 drops in the affected ear(s) twice daily for 7 days.  You are welcome to use it in both ears.  Zyrtec (cetirizine): This is an excellent second-generation antihistamine that helps to reduce respiratory inflammatory response to environmental allergens.  In some patients, this medication can cause daytime sleepiness so I recommend that you take 1 tablet daily at bedtime.     Flonase (fluticasone): This is a steroid nasal spray that used once daily, 1  spray in each nare.  This works best when used on a daily basis. This medication does not work well if it is only used when you think you need it.  After 3 to 5 days of use, you will notice significant reduction of the inflammation and mucus production that is currently being caused by exposure to allergens, whether seasonal or environmental.  The most common side effect of this medication is nosebleeds.  If you experience a nosebleed,  please discontinue use for 1 week, then feel free to resume.  If you find that your insurance will not pay for this medication, please consider a different nasal steroids such as Nasonex (mometasone), or Nasacort (triamcinolone).   Advil, Motrin (ibuprofen): This is a good anti-inflammatory medication which addresses aches, pains and inflammation of the upper airways that causes sinus and nasal congestion as well as in the lower airways which makes your cough feel tight and sometimes burn.  I recommend that you take between 400 to 600 mg every 6-8 hours as needed.  Please do not take more than 2400 mg of ibuprofen in a 24-hour period and please do not take high doses of ibuprofen for more than 3 days in a row as this can lead to stomach ulcers.  Please also consider monthly or bimonthly earwax removal treatment at home using Debrox earwax removal kit.  I have included a picture of the kit for you so that you can find it at the pharmacy.  If you find that your health insurance will not pay for allergy medications, please consider downloading the GoodRx app and using to get a better price than the "off the shelf" price.     If your pain has not meaningfully improved in the next 7 to 10 days, please return for repeat evaluation or follow-up with your regular provider.  If symptoms have worsened in the next 3 to 5 days, please return for repeat evaluation or follow-up with your regular provider.    Thank you for visiting urgent care today.  We appreciate the opportunity to participate in your care.       This office note has been dictated using Teaching laboratory technician.  Unfortunately, this method of dictation can sometimes lead to typographical or grammatical errors.  I apologize for your inconvenience in advance if this occurs.  Please do not hesitate to reach out to me if clarification is needed.      Theadora Rama Scales, PA-C 04/21/23 1042

## 2023-04-21 NOTE — Discharge Instructions (Addendum)
Please read below to learn more about the medications, dosages and frequencies that I recommend to help alleviate your symptoms and to get you feeling better soon:   Ciprodex (ciprofloxacin, dexamethasone): Please instill 4 drops in the affected ear(s) twice daily for 7 days.  You are welcome to use it in both ears.  Zyrtec (cetirizine): This is an excellent second-generation antihistamine that helps to reduce respiratory inflammatory response to environmental allergens.  In some patients, this medication can cause daytime sleepiness so I recommend that you take 1 tablet daily at bedtime.     Flonase (fluticasone): This is a steroid nasal spray that used once daily, 1 spray in each nare.  This works best when used on a daily basis. This medication does not work well if it is only used when you think you need it.  After 3 to 5 days of use, you will notice significant reduction of the inflammation and mucus production that is currently being caused by exposure to allergens, whether seasonal or environmental.  The most common side effect of this medication is nosebleeds.  If you experience a nosebleed, please discontinue use for 1 week, then feel free to resume.  If you find that your insurance will not pay for this medication, please consider a different nasal steroids such as Nasonex (mometasone), or Nasacort (triamcinolone).   Advil, Motrin (ibuprofen): This is a good anti-inflammatory medication which addresses aches, pains and inflammation of the upper airways that causes sinus and nasal congestion as well as in the lower airways which makes your cough feel tight and sometimes burn.  I recommend that you take between 400 to 600 mg every 6-8 hours as needed.  Please do not take more than 2400 mg of ibuprofen in a 24-hour period and please do not take high doses of ibuprofen for more than 3 days in a row as this can lead to stomach ulcers.  Please also consider monthly or bimonthly earwax removal treatment  at home using Debrox earwax removal kit.  I have included a picture of the kit for you so that you can find it at the pharmacy.  If you find that your health insurance will not pay for allergy medications, please consider downloading the GoodRx app and using to get a better price than the "off the shelf" price.     If your pain has not meaningfully improved in the next 7 to 10 days, please return for repeat evaluation or follow-up with your regular provider.  If symptoms have worsened in the next 3 to 5 days, please return for repeat evaluation or follow-up with your regular provider.    Thank you for visiting urgent care today.  We appreciate the opportunity to participate in your care.

## 2023-05-02 ENCOUNTER — Ambulatory Visit (HOSPITAL_BASED_OUTPATIENT_CLINIC_OR_DEPARTMENT_OTHER): Payer: MEDICAID | Admitting: Student

## 2023-05-02 VITALS — BP 125/80 | HR 78 | Ht 65.0 in | Wt 217.6 lb

## 2023-05-02 DIAGNOSIS — F339 Major depressive disorder, recurrent, unspecified: Secondary | ICD-10-CM

## 2023-05-02 DIAGNOSIS — F411 Generalized anxiety disorder: Secondary | ICD-10-CM | POA: Diagnosis not present

## 2023-05-02 DIAGNOSIS — F4310 Post-traumatic stress disorder, unspecified: Secondary | ICD-10-CM | POA: Diagnosis not present

## 2023-05-02 MED ORDER — ARIPIPRAZOLE 15 MG PO TABS: 15.0000 mg | ORAL_TABLET | Freq: Every day | ORAL | 1 refills | Status: AC

## 2023-05-02 MED ORDER — SERTRALINE HCL 50 MG PO TABS: 50.0000 mg | ORAL_TABLET | Freq: Every day | ORAL | 0 refills | Status: AC

## 2023-05-02 NOTE — Patient Instructions (Signed)
[facility] Phone: [phone] Address: [address], [place], [place] [address] Hours: Monday to Friday; 8:30AM - 5PM  
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
## 2023-05-03 ENCOUNTER — Encounter (HOSPITAL_COMMUNITY): Payer: Self-pay | Admitting: Student

## 2023-05-05 NOTE — Addendum Note (Signed)
Addended by: Theodoro Kos A on: 05/05/2023 03:21 PM   Modules accepted: Level of Service

## 2023-05-30 ENCOUNTER — Ambulatory Visit (HOSPITAL_COMMUNITY): Payer: MEDICAID | Admitting: Student

## 2023-06-29 ENCOUNTER — Ambulatory Visit (HOSPITAL_COMMUNITY): Payer: MEDICAID | Admitting: Student

## 2024-04-24 ENCOUNTER — Encounter (HOSPITAL_COMMUNITY): Payer: Self-pay

## 2024-04-24 ENCOUNTER — Ambulatory Visit (HOSPITAL_COMMUNITY)
Admission: EM | Admit: 2024-04-24 | Discharge: 2024-04-24 | Disposition: A | Discharge status: Home / Self Care | Payer: MEDICAID

## 2024-04-24 DIAGNOSIS — H60502 Unspecified acute noninfective otitis externa, left ear: Secondary | ICD-10-CM | POA: Diagnosis not present

## 2024-04-24 MED ORDER — OFLOXACIN 0.3 % OT SOLN: 10.0000 [drp] | Freq: Every day | OTIC | 0 refills | Status: AC

## 2024-04-24 NOTE — ED Provider Notes (Signed)
 [facility]    CSN: [id number] Arrival date & time: 04/24/24  0825      History   Chief Complaint Chief Complaint  Patient presents with   Otalgia    HPI Sabrina Poole is a 23 y.o. female.   Patient is a 23 year old female who presents to the urgent care today with concerns of left ear pain.  She reports it has been going on for the past 5 days.  She denies any recent swimming or trauma to the ear.  She has been taking Tylenol  and ibuprofen  which has been helping with the pain.  She does report some drainage from the ear.  She also reports a decrease in hearing of the left ear. She denies any fever, neurologic changes, vomiting, swelling behind the ear, or other concerns at this time.    Past Medical History:  Diagnosis Date   ADHD (attention deficit hyperactivity disorder)    Anxiety    mild anxiety   Decreased visual acuity 12/03/2015   Depression    PCOS (polycystic ovarian syndrome)    Schizophrenia (HCC)     Patient Active Problem List   Diagnosis Date Noted   PTSD (post-traumatic stress disorder) 05/02/2023   GAD (generalized anxiety disorder) 05/02/2023   Positive screening for depression on 9-item Patient Health Questionnaire (PHQ-9) 07/27/2022   Primary oligomenorrhea 07/27/2022   PCOS (polycystic ovarian syndrome) 07/27/2022   Subjective vision disturbance, left eye    Complicated migraine 12/03/2015   Decreased visual acuity 12/03/2015   New daily persistent headache 12/01/2015   Conversion disorder    Hallucinations    Observed seizure-like activity (HCC)    Cerebral calcification 11/11/2015   Mood disorder (HCC)    Depression 06/20/2014   ADHD (attention deficit hyperactivity disorder), 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   Acanthosis nigricans 06/20/2014   Body mass index, pediatric, greater than or equal to 95th percentile for age 41/12/2013    Past Surgical History:  Procedure Laterality Date   ADENOIDECTOMY     RADIOLOGY WITH ANESTHESIA N/A  11/12/2015   Procedure: RADIOLOGY WITH ANESTHESIA;  Surgeon: Medication Radiologist, MD;  Location: [facility];  Service: Radiology;  Laterality: N/A;   TONSILLECTOMY     TONSILLECTOMY AND ADENOIDECTOMY N/A 02/22/2017   Procedure: TONSILLECTOMY AND ADENOIDECTOMY;  Surgeon: Karis Clunes, MD;  Location: [facility];  Service: ENT;  Laterality: N/A;   WISDOM TOOTH EXTRACTION Bilateral 04/13/2023    OB History     Gravida  0   Para      Term      Preterm      AB      Living         SAB      IAB      Ectopic      Multiple      Live Births               Home Medications    Prior to Admission medications   Medication Sig Start Date End Date Taking? Authorizing Provider  ofloxacin  (FLOXIN ) 0.3 % OTIC solution Place 10 drops into the left ear daily for 7 days. 04/24/24 05/01/24 Yes Melonie Locus, PA-C  ARIPiprazole  (ABILIFY ) 15 MG tablet Take 1 tablet (15 mg total) by mouth daily. 05/02/23   Nguyen, Julie, DO  cetirizine  (ZYRTEC  ALLERGY) 10 MG tablet Take 1 tablet (10 mg total) by mouth at bedtime. 04/21/23 10/18/23  Joesph Shaver Scales, PA-C  ciprofloxacin -dexamethasone  (CIPRODEX ) OTIC suspension Place 4 drops into the  left ear 2 (two) times daily. 04/21/23   Joesph Shaver Scales, PA-C  fluticasone  (FLONASE ) 50 MCG/ACT nasal spray Place 1 spray into both nostrils daily. 04/21/23   Joesph Shaver Scales, PA-C  ibuprofen  (ADVIL ) 800 MG tablet Take 1 tablet (800 mg total) by mouth 3 (three) times daily. 04/06/23   Dreama, [place]  N, FNP  sertraline  (ZOLOFT ) 50 MG tablet Take 1 tablet (50 mg total) by mouth daily. 05/02/23 06/01/23  Nguyen, Julie, DO    Family History Family History  Problem Relation Age of Onset   Diabetes Mother    Allergic rhinitis Mother    Asthma Mother    Hypertension Maternal Grandmother    Heart disease Maternal Grandmother    Hyperlipidemia Maternal Grandmother    Hypertension Maternal Grandfather    Heart disease Maternal Grandfather     Hyperlipidemia Maternal Grandfather    Hypertension Paternal Grandmother    Heart disease Paternal Grandmother    Hyperlipidemia Paternal Grandmother    Hypertension Paternal Grandfather    Heart disease Paternal Grandfather    Hyperlipidemia Paternal Grandfather     Social History Social History   Tobacco Use   Smoking status: Never   Smokeless tobacco: Never  Vaping Use   Vaping status: Every Day   Substances: Nicotine, Flavoring  Substance Use Topics   Alcohol use: No   Drug use: Yes    Types: Marijuana     Allergies   Patient has no known allergies.   Review of Systems Review of Systems See HPI for relevant ROS.  Physical Exam Triage Vital Signs ED Triage Vitals [04/24/24 0924]  Encounter Vitals Group     BP 103/68     Girls Systolic BP Percentile      Girls Diastolic BP Percentile      Boys Systolic BP Percentile      Boys Diastolic BP Percentile      Pulse Rate 91     Resp 14     Temp 98.5 F (36.9 C)     Temp Source Oral     SpO2 96 %     Weight      Height      Head Circumference      Peak Flow      Pain Score 10     Pain Loc      Pain Education      Exclude from Growth Chart    No data found.  Updated Vital Signs BP 103/68 (BP Location: Right Arm)   Pulse 91   Temp 98.5 F (36.9 C) (Oral)   Resp 14   LMP 04/03/2024 (Approximate)   SpO2 96%   Visual Acuity Right Eye Distance:   Left Eye Distance:   Bilateral Distance:    Right Eye Near:   Left Eye Near:    Bilateral Near:     Physical Exam General: Alert and oriented, well-developed/well-nourished, calm, cooperative, no acute distress HEENT: Normocephalic atraumatic, moist mucous membranes, no scleral icterus, trachea midline, right TM nonbulging erythematous or cloudy, pain to palpation of the left TM, swelling of the left ear canal, drainage from the left ear Lungs: Speaking full sentences, non-labored respirations, no distress Abdomen:  Soft, nondistended Musculoskeletal:  Moves all extremities well Neurologic: Awake, A&O x4, gait normal Integumentary: Warm, dry, normal for ethnicity, intact, no rash Psychiatric: Appropriate mood & affect  UC Treatments / Results  Labs (all labs ordered are listed, but only abnormal results are displayed) Labs Reviewed - No data to display  EKG   Radiology No results found.  Procedures Procedures (including critical care time)  Medications Ordered in UC Medications - No data to display  Initial Impression / Assessment and Plan / UC Course  I have reviewed the triage vital signs and the nursing notes.  Pertinent labs & imaging results that were available during my care of the patient were reviewed by me and considered in my medical decision making (see chart for details).   Presents with left ear pain.  Differential diagnosis includes: Otitis externa, otitis media, eustachian tube dysfunction, dental infection, mastoiditis, sinusitis, URI, auricular perichondritis, TMJ, including other diagnoses.  History obtained from: Patient.  Plan: Well-appearing patient with symptoms/signs likely for otitis externa. No evidence of mastoiditis, sinusitis and patient at baseline mental status making intracranial abscess, meningitis, or other intracranial process unlikely. Symptoms are also not consistent with more concerning sepsis or focal bacterial infection. Therefore, patient likely stable for safe discharge home. Patient should take Tylenol  or ibuprofen  as needed for pain relief. Discussed strict return precautions including fever, worsening of symptoms, visible swelling and redness, changes in neurological status.  Patient agreed with assessment and plan. Prescribed ofloxacin  for treatment of otitis externa. Patient should follow up with their PCP in the next several days.  Disposition: Stable for discharge.   All questions answered to the best of this examiner's ability. Advised to f/u with PCP for further eval and/or  reassessment. Patient agrees to plan.  An appropriate evaluation has been performed, and in my medical judgment there is currently no evidence of an immediate life-threatening or surgical condition. Discharge is therefore indicated at this time.  This document was created using the aid of voice recognition Scientist, clinical (histocompatibility and immunogenetics).  Final Clinical Impressions(s) / UC Diagnoses   Final diagnoses:  Acute otitis externa of left ear, unspecified type     Discharge Instructions      We have sent in an antibiotic eardrop to help with your symptoms.  You can continue to take Tylenol  and ibuprofen  as needed for pain relief.  Avoid swimming or submerging the ear in water until symptoms have improved.  We recommend following up with your primary care provider if symptoms are not improving.  Please return or go to emergency department if you have any fever, worsening of symptoms, severe swelling behind the ear, or if you have any other concerns.    ED Prescriptions     Medication Sig Dispense Auth. Provider   ofloxacin  (FLOXIN ) 0.3 % OTIC solution Place 10 drops into the left ear daily for 7 days. 5 mL Melonie Locus, PA-C      PDMP not reviewed this encounter.   Melonie Locus, PA-C 04/24/24 1103

## 2024-04-24 NOTE — ED Triage Notes (Signed)
 Patient c/o left ear pain x 5 days.   Patient states she has been taking ibuprofen  and Tylenol  together for pain and the last dose was at 0630 today.

## 2024-04-24 NOTE — Discharge Instructions (Addendum)
 We have sent in an antibiotic eardrop to help with your symptoms.  You can continue to take Tylenol  and ibuprofen  as needed for pain relief.  Avoid swimming or submerging the ear in water until symptoms have improved.  We recommend following up with your primary care provider if symptoms are not improving.  Please return or go to emergency department if you have any fever, worsening of symptoms, severe swelling behind the ear, or if you have any other concerns.
